# Patient Record
Sex: Female | Born: 2000
Health system: Midwestern US, Community
[De-identification: ages and names within clinical notes are randomized; demographics above are authoritative.]

## PROBLEM LIST (undated history)

## (undated) DIAGNOSIS — Z3492 Encounter for supervision of normal pregnancy, unspecified, second trimester: Secondary | ICD-10-CM

## (undated) DIAGNOSIS — O4700 False labor before 37 completed weeks of gestation, unspecified trimester: Secondary | ICD-10-CM

## (undated) DIAGNOSIS — Z3A36 36 weeks gestation of pregnancy: Secondary | ICD-10-CM

## (undated) DIAGNOSIS — F32A Depression, unspecified: Secondary | ICD-10-CM

## (undated) DIAGNOSIS — F419 Anxiety disorder, unspecified: Secondary | ICD-10-CM

## (undated) DIAGNOSIS — D649 Anemia, unspecified: Secondary | ICD-10-CM

## (undated) DIAGNOSIS — J45909 Unspecified asthma, uncomplicated: Secondary | ICD-10-CM

## (undated) HISTORY — PX: NO PAST SURGERIES: SHX2092

---
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
## 2021-05-08 ENCOUNTER — Emergency Department: Admit: 2021-05-09 | Payer: BLUE CROSS/BLUE SHIELD

## 2021-05-08 DIAGNOSIS — O2691 Pregnancy related conditions, unspecified, first trimester: Secondary | ICD-10-CM

## 2021-05-08 DIAGNOSIS — O219 Vomiting of pregnancy, unspecified: Secondary | ICD-10-CM

## 2021-05-08 NOTE — ED Provider Notes (Signed)
Chief complaint:  Abdominal pain    HPI history provided by the patient  Patient presents here gravida 2, para 1, aborta 0 currently about [redacted] weeks gestation per her account.  Has not had an ultrasound yet.  Complaining of several days of some low pelvic abdominal cramping.  Has had nausea and vomiting throughout the pregnancy and is on Phenergan at home for that stating she took some Phenergan earlier and it is controlling her nausea.  Denies dysuria or hematuria or flank pain.  States the pain is just slowly getting worse for last several days.  No fevers.  No dysuria.  No vaginal bleeding or discharge.    Review of Systems   Constitutional:  Negative for chills, diaphoresis, fatigue and fever.   HENT:  Negative for congestion and sore throat.    Respiratory:  Negative for cough, chest tightness, shortness of breath and wheezing.    Cardiovascular:  Negative for chest pain, palpitations and leg swelling.   Gastrointestinal:  Positive for abdominal pain and nausea. Negative for abdominal distention, diarrhea and vomiting.   Genitourinary:  Negative for dysuria, flank pain, frequency, hematuria, urgency, vaginal bleeding and vaginal discharge.   Musculoskeletal:  Negative for arthralgias, back pain, gait problem, joint swelling, myalgias, neck pain and neck stiffness.   Skin:  Negative for rash and wound.   Neurological:  Negative for dizziness, seizures, syncope, weakness, light-headedness, numbness and headaches.   All other systems reviewed and are negative.     Physical Exam  Vitals and nursing note reviewed.   Constitutional:       General: She is awake. She is not in acute distress.     Appearance: She is well-developed. She is not ill-appearing, toxic-appearing or diaphoretic.   HENT:      Head: Normocephalic and atraumatic.      Mouth/Throat:      Pharynx: Oropharynx is clear. Uvula midline. No pharyngeal swelling, oropharyngeal exudate, posterior oropharyngeal erythema or uvula swelling.      Tonsils: No  tonsillar exudate or tonsillar abscesses.   Eyes:      General: No scleral icterus.     Pupils: Pupils are equal, round, and reactive to light.   Cardiovascular:      Rate and Rhythm: Normal rate and regular rhythm.      Heart sounds: Normal heart sounds. No murmur heard.  Pulmonary:      Effort: Pulmonary effort is normal. No respiratory distress.      Breath sounds: Normal breath sounds. No stridor, decreased air movement or transmitted upper airway sounds. No decreased breath sounds, wheezing, rhonchi or rales.   Chest:      Chest wall: No tenderness.   Abdominal:      General: Bowel sounds are normal. There is no distension.      Palpations: Abdomen is soft.      Tenderness: There is no abdominal tenderness. There is no right CVA tenderness, left CVA tenderness, guarding or rebound.      Comments: Abdomen and anterior pelvic area soft and nontender during exam.  No focal right pelvic or left pelvic or right lower or left lower abdominal pain.  No distention.  No CVA tenderness.   Musculoskeletal:         General: No swelling, tenderness, deformity or signs of injury.      Cervical back: Full passive range of motion without pain, normal range of motion and neck supple. No signs of trauma or rigidity. No spinous process tenderness or  muscular tenderness. Normal range of motion.      Right lower leg: No edema.      Left lower leg: No edema.      Comments: Arms and legs are neurovascular intact and well perfused with no pretibial edema or calf pain.   Skin:     General: Skin is warm and dry.      Coloration: Skin is not cyanotic, jaundiced, mottled or pale.      Findings: No bruising, erythema or rash.   Neurological:      General: No focal deficit present.      Mental Status: She is alert and oriented to person, place, and time.      GCS: GCS eye subscore is 4. GCS verbal subscore is 5. GCS motor subscore is 6.      Cranial Nerves: No cranial nerve deficit.      Coordination: Coordination normal.   Psychiatric:          Behavior: Behavior is cooperative.        Procedures     MDM     History provided by: The patient    Patient here gravida 2, para 1, aborta 0 at [redacted] weeks gestation complaining of abdominal and pelvic pain and cramping for several days.  Worsening today.  Has not had an ultrasound yet.  Denies vaginal bleeding or discharge.  Denies dysuria or hematuria or flank pain.  No fevers.  Has had some nausea and vomiting throughout pregnancy controlled with Phenergan at home.  No chest pain.  Physical exam is unremarkable with no current abdominal pain on exam and no abdominal distention.  Heart rate regular, lungs are clear and equal.  See above for full exam.      Social factors affecting care: None  Chronic conditions affecting care: None  Chart reviewed: None    Differential includes but not limited to: Ectopic pregnancy, pregnancy, UTI    Work up includes with interpretations: Quantitative hCG 62,375.  CBC unremarkable white count 11.2, hemoglobin 11.9, hematocrit 34.7.  Not anemic, no significant sign of infection.  Afebrile.  CMP unremarkable renal function normal BUN 5 creatinine 0.5.  Electrolytes normal, LFTs normal.  Unlikely biliary disease.  Magnesium 1.9.  Urinalysis gross micro clean with no abnormalities no signs of infection.  Ultrasound pelvis read by radiologist shows single live intrauterine pregnancy 7 weeks 0 days no evidence of ectopic.    Advanced directive discussion: None    Treatment in [facility]:PRFF Tylenol    Consultations in ER: None    Diagnosis and disposition: Pregnancy.  Patient stable for discharge and outpatient follow-up.      ED Course as of 05/08/21 2252   Wed May 08, 2021   2251 Patient laying the bed resting comfortably no distress.  Updated on work-up results, states she is feeling much better and ready to go home.  She will follow closely with her OB/GYN. [NC]      ED Course User Index  [NC] Suzette Battiest, DO        ED Course as of 05/08/21 2253   Wed May 08, 2021   2251 Patient  laying the bed resting comfortably no distress.  Updated on work-up results, states she is feeling much better and ready to go home.  She will follow closely with her OB/GYN. [NC]      ED Course User Index  [NC] Ronda Fairly Maddox Hlavaty, DO       --------------------------------------------- PAST HISTORY ---------------------------------------------  Past  Medical History:  has a past medical history of Asthma.    Past Surgical History:  has no past surgical history on file.    Social History:  reports that she has never smoked. She has never been exposed to tobacco smoke. She has never used smokeless tobacco. She reports that she does not drink alcohol and does not use drugs.    Family History: family history is not on file.     The patient's home medications have been reviewed.    Allergies: Amoxicillin, Cefdinir, Cefzil [cefprozil], and Clindamycin/lincomycin    -------------------------------------------------- RESULTS -------------------------------------------------  Labs:  Results for orders placed or performed during the hospital encounter of 05/08/21   HCG, Quantitative, Pregnancy   Result Value Ref Range    hCG Quant 62375.0 (H) <10 mIU/mL   CBC with Auto Differential   Result Value Ref Range    WBC 11.2 4.5 - 11.5 E9/L    RBC 3.80 3.50 - 5.50 E12/L    Hemoglobin 11.9 11.5 - 15.5 g/dL    Hematocrit 16.1 09.6 - 48.0 %    MCV 91.3 80.0 - 99.9 fL    MCH 31.3 26.0 - 35.0 pg    MCHC 34.3 32.0 - 34.5 %    RDW 12.0 11.5 - 15.0 fL    Platelets 210 130 - 450 E9/L    MPV 9.2 7.0 - 12.0 fL    Neutrophils % 77.3 43.0 - 80.0 %    Immature Granulocytes % 0.6 0.0 - 5.0 %    Lymphocytes % 12.5 (L) 20.0 - 42.0 %    Monocytes % 7.8 2.0 - 12.0 %    Eosinophils % 1.4 0.0 - 6.0 %    Basophils % 0.4 0.0 - 2.0 %    Neutrophils Absolute 8.63 (H) 1.80 - 7.30 E9/L    Immature Granulocytes # 0.07 E9/L    Lymphocytes Absolute 1.39 (L) 1.50 - 4.00 E9/L    Monocytes Absolute 0.87 0.10 - 0.95 E9/L    Eosinophils Absolute 0.16 0.05 - 0.50 E9/L     Basophils Absolute 0.04 0.00 - 0.20 E9/L   Comprehensive Metabolic Panel   Result Value Ref Range    Sodium 137 132 - 146 mmol/L    Potassium 3.6 3.5 - 5.0 mmol/L    Chloride 103 98 - 107 mmol/L    CO2 22 22 - 29 mmol/L    Anion Gap 12 7 - 16 mmol/L    Glucose 99 74 - 99 mg/dL    BUN 5 (L) 6 - 20 mg/dL    Creatinine 0.5 0.5 - 1.0 mg/dL    Est, Glom Filt Rate >60 >=60 mL/min/1.73    Calcium 9.2 8.6 - 10.2 mg/dL    Total Protein 7.1 6.4 - 8.3 g/dL    Albumin 4.3 3.5 - 5.2 g/dL    Total Bilirubin 0.5 0.0 - 1.2 mg/dL    Alkaline Phosphatase 41 35 - 104 U/L    ALT 9 0 - 32 U/L    AST 12 0 - 31 U/L   Magnesium   Result Value Ref Range    Magnesium 1.9 1.6 - 2.6 mg/dL   Urinalysis   Result Value Ref Range    Color, UA Yellow Straw/Yellow    Clarity, UA Clear Clear    Glucose, Ur Negative Negative mg/dL    Bilirubin Urine Negative Negative    Ketones, Urine Negative Negative mg/dL    Specific Gravity, UA 1.020 1.005 - 1.030    Blood,  Urine Negative Negative    pH, UA 6.5 5.0 - 9.0    Protein, UA Negative Negative mg/dL    Urobilinogen, Urine 0.2 <2.0 E.U./dL    Nitrite, Urine Negative Negative    Leukocyte Esterase, Urine Negative Negative       Radiology:  US OB TRANSVAGINAL   Final Result   Single live intrauterine pregnancy with EGA [redacted] weeks, 0 days for EDD   12/25/2021.      No sonographic evidence for an ectopic pregnancy.      Trace fluid in endocervical canal which is nonspecific.      Suspect corpus luteum cyst in right ovary, as detailed above.      Please correlate with serial beta HCGs and follow-up pelvic ultrasound.             ------------------------- NURSING NOTES AND VITALS REVIEWED ---------------------------  Date / Time Roomed:  05/08/2021  9:02 PM  ED Bed Assignment:  08/08    The nursing notes within the ED encounter and vital signs as below have been reviewed.   BP (!) 106/54   Pulse 90   Temp 98.8 F (37.1 C) (Oral)   Resp 18   Ht 5\' 1"  (1.549 m)   Wt 110 lb (49.9 kg)   SpO2 100%   BMI 20.78  kg/m   Oxygen Saturation Interpretation: Normal      ------------------------------------------ PROGRESS NOTES ------------------------------------------  I have spoken with the patient and discussed today's results, in addition to providing specific details for the plan of care and counseling regarding the diagnosis and prognosis.  Their questions are answered at this time and they are agreeable with the plan. I discussed at length with them reasons for immediate return here for re evaluation. They will followup with primary care by calling their office tomorrow.      --------------------------------- ADDITIONAL PROVIDER NOTES ---------------------------------  At this time the patient is without objective evidence of an acute process requiring hospitalization or inpatient management.  They have remained hemodynamically stable throughout their entire ED visit and are stable for discharge with outpatient follow-up.     The plan has been discussed in detail and they are aware of the specific conditions for emergent return, as well as the importance of follow-up.      New Prescriptions    No medications on file       Diagnosis:  1. Complication of pregnancy in first trimester        Disposition:  Patient's disposition: Discharge to home  Patient's condition is stable.           , DO  05/08/21 2253

## 2021-05-08 NOTE — ED Notes (Signed)
Patient to ultrasound     Cherre Robins, RN  05/08/21 2129

## 2021-05-08 NOTE — Discharge Instructions (Signed)
Return if symptoms change or worsen.     Thank you for the opportunity to care for you during this emergency department visit. I hope you are doing better. We strive to always improve our care. You may receive a survey and I hope you had a positive experience here. We greatly appreciate your 5 scores.

## 2021-05-09 ENCOUNTER — Inpatient Hospital Stay
Admit: 2021-05-09 | Discharge: 2021-05-09 | Disposition: A | Discharge status: Home / Self Care | Payer: BLUE CROSS/BLUE SHIELD | Attending: Emergency Medicine

## 2021-05-09 LAB — COMPREHENSIVE METABOLIC PANEL
ALT: 9 U/L (ref 0–32)
AST: 12 U/L (ref 0–31)
Albumin: 4.3 g/dL (ref 3.5–5.2)
Alkaline Phosphatase: 41 U/L (ref 35–104)
Anion Gap: 12 mmol/L (ref 7–16)
BUN: 5 mg/dL — ABNORMAL LOW (ref 6–20)
CO2: 22 mmol/L (ref 22–29)
Calcium: 9.2 mg/dL (ref 8.6–10.2)
Chloride: 103 mmol/L (ref 98–107)
Creatinine: 0.5 mg/dL (ref 0.5–1.0)
Est, Glom Filt Rate: >60 mL/min/{1.73_m2} (ref >=60)
Glucose: 99 mg/dL (ref 74–99)
Potassium: 3.6 mmol/L (ref 3.5–5.0)
Sodium: 137 mmol/L (ref 132–146)
Total Bilirubin: 0.5 mg/dL (ref 0.0–1.2)
Total Protein: 7.1 g/dL (ref 6.4–8.3)

## 2021-05-09 LAB — CBC WITH AUTO DIFFERENTIAL
Basophils %: 0.4 % (ref 0.0–2.0)
Basophils Absolute: 0.04 E9/L (ref 0.00–0.20)
Eosinophils %: 1.4 % (ref 0.0–6.0)
Eosinophils Absolute: 0.16 E9/L (ref 0.05–0.50)
Hematocrit: 34.7 % (ref 34.0–48.0)
Hemoglobin: 11.9 g/dL (ref 11.5–15.5)
Immature Granulocytes #: 0.07 E9/L
Immature Granulocytes %: 0.6 % (ref 0.0–5.0)
Lymphocytes %: 12.5 % — ABNORMAL LOW (ref 20.0–42.0)
Lymphocytes Absolute: 1.39 E9/L — ABNORMAL LOW (ref 1.50–4.00)
MCH: 31.3 pg (ref 26.0–35.0)
MCHC: 34.3 % (ref 32.0–34.5)
MCV: 91.3 fL (ref 80.0–99.9)
MPV: 9.2 fL (ref 7.0–12.0)
Monocytes %: 7.8 % (ref 2.0–12.0)
Monocytes Absolute: 0.87 E9/L (ref 0.10–0.95)
Neutrophils %: 77.3 % (ref 43.0–80.0)
Neutrophils Absolute: 8.63 E9/L — ABNORMAL HIGH (ref 1.80–7.30)
Platelets: 210 E9/L (ref 130–450)
RBC: 3.8 E12/L (ref 3.50–5.50)
RDW: 12 fL (ref 11.5–15.0)
WBC: 11.2 E9/L (ref 4.5–11.5)

## 2021-05-09 LAB — URINALYSIS
Bilirubin Urine: NEGATIVE
Blood, Urine: NEGATIVE
Glucose, Ur: NEGATIVE mg/dL
Ketones, Urine: NEGATIVE mg/dL
Leukocyte Esterase, Urine: NEGATIVE
Nitrite, Urine: NEGATIVE
Protein, UA: NEGATIVE mg/dL
Specific Gravity, UA: 1.02 (ref 1.005–1.030)
Urobilinogen, Urine: 0.2 E.U./dL (ref <2.0)
pH, UA: 6.5 (ref 5.0–9.0)

## 2021-05-09 LAB — MAGNESIUM: Magnesium: 1.9 mg/dL (ref 1.6–2.6)

## 2021-05-09 LAB — HCG, QUANTITATIVE, PREGNANCY: hCG Quant: 62375 m[IU]/mL — ABNORMAL HIGH (ref <10)

## 2021-05-09 MED ORDER — SODIUM CHLORIDE 0.9 % IV BOLUS
0.9 % | Freq: Once | INTRAVENOUS | Status: AC
Start: 2021-05-09 — End: 2021-05-08
  Administered 2021-05-09: 01:00:00 1000 mL via INTRAVENOUS

## 2021-05-09 MED ORDER — ACETAMINOPHEN 325 MG PO TABS
325 MG | Freq: Once | ORAL | Status: AC
Start: 2021-05-09 — End: 2021-05-08
  Administered 2021-05-09: 01:00:00 650 mg via ORAL

## 2021-05-09 MED FILL — MAPAP 325 MG PO TABS: 325 MG | ORAL | Qty: 2

## 2021-06-23 IMAGING — US US MFM OB LIMITED
2 series · 15 of 19 positions shown · non-contrast
Comparison: none

[Series 1: us mfm ob limited · 14 of 18 slices shown (1 of 2)]
[im 1/18]
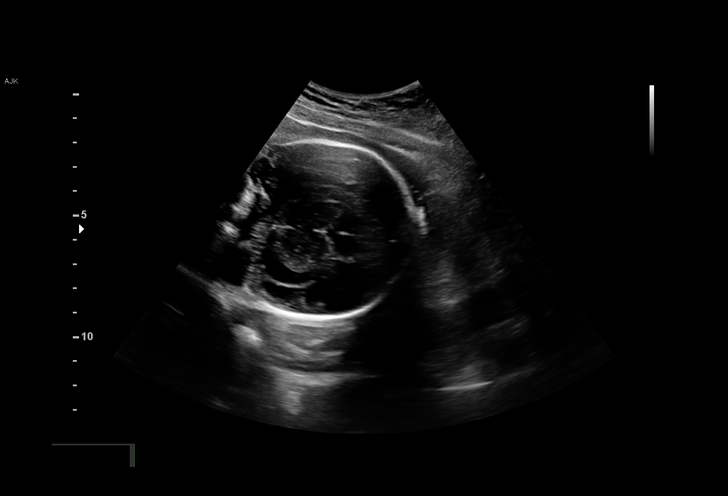
[im 2/18]
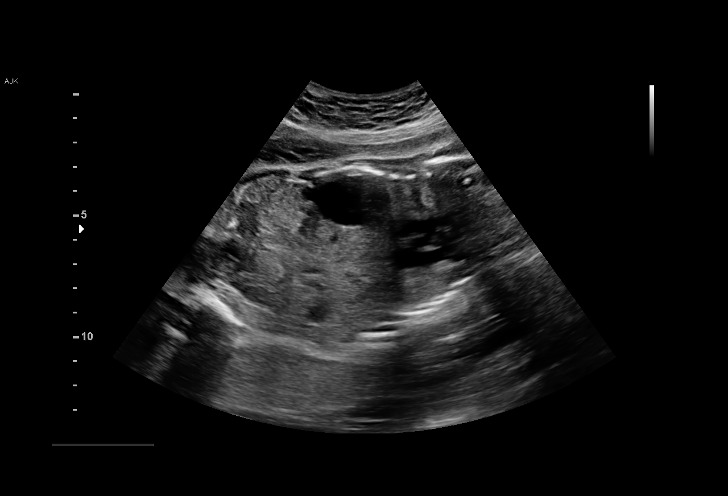
[im 4/18]
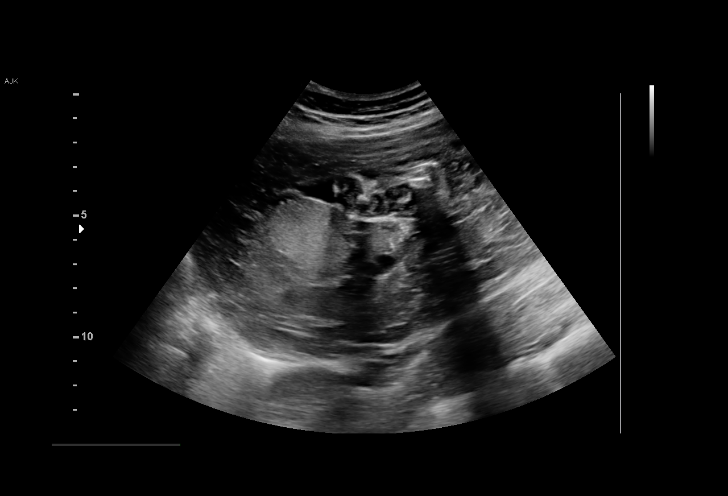
[im 5/18]
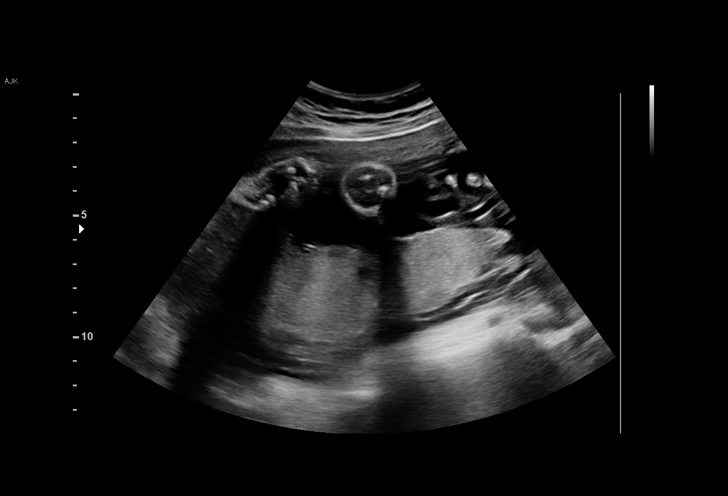
[im 6/18]
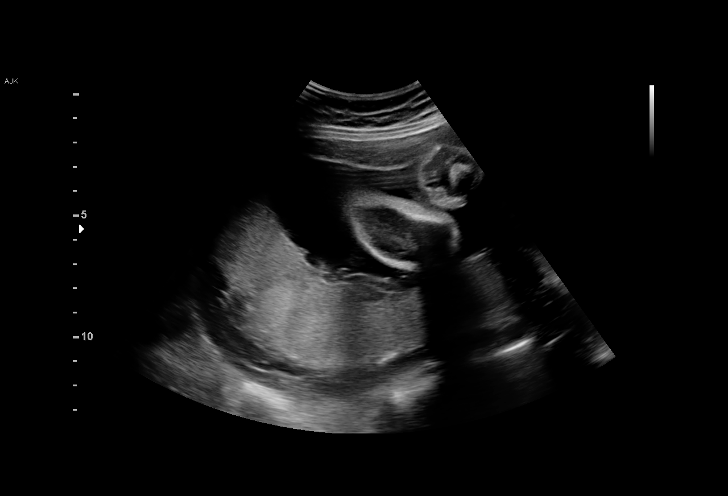
[im 7/18]
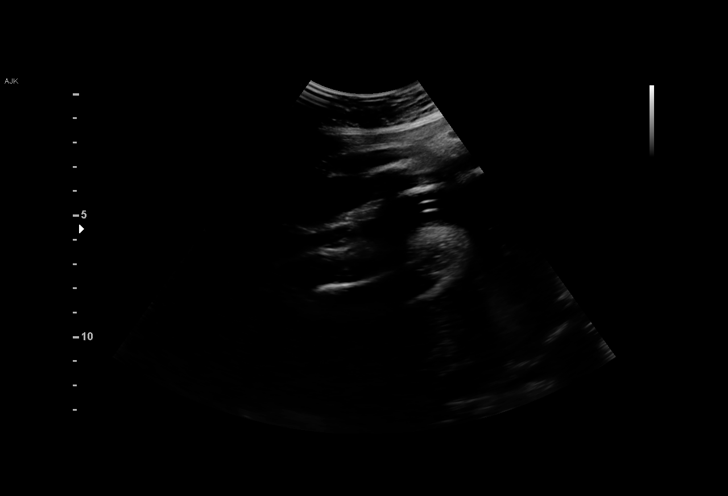
[im 9/18]
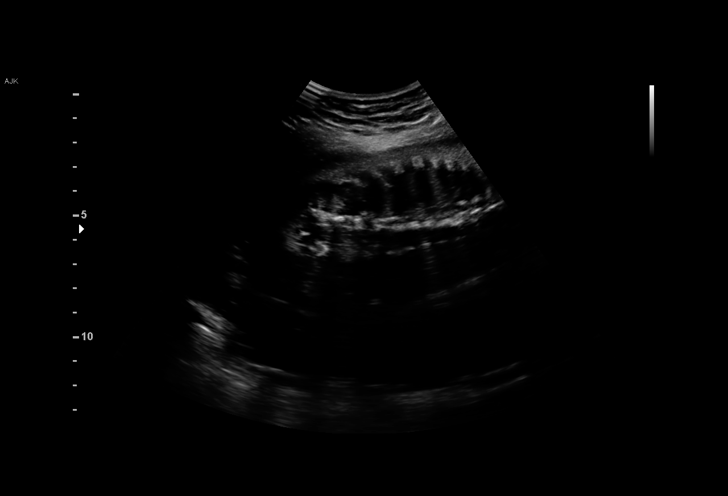
[im 10/18]
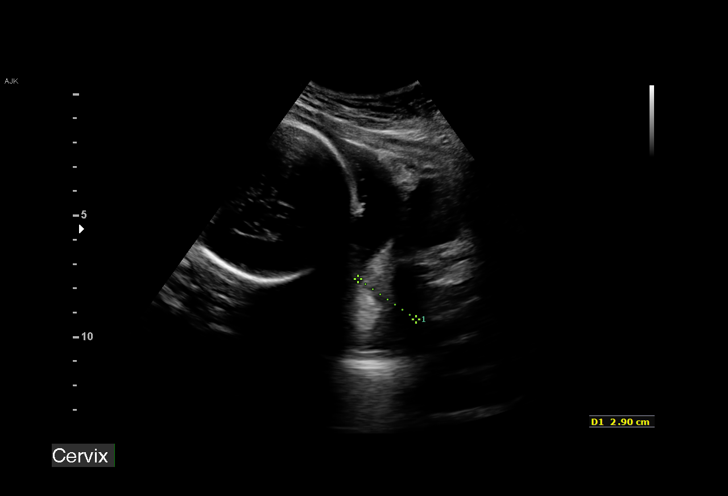
[im 11/18]
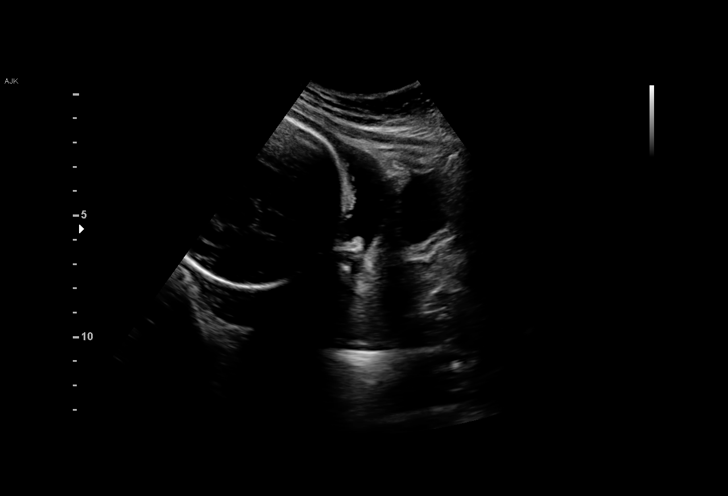
[im 13/18]
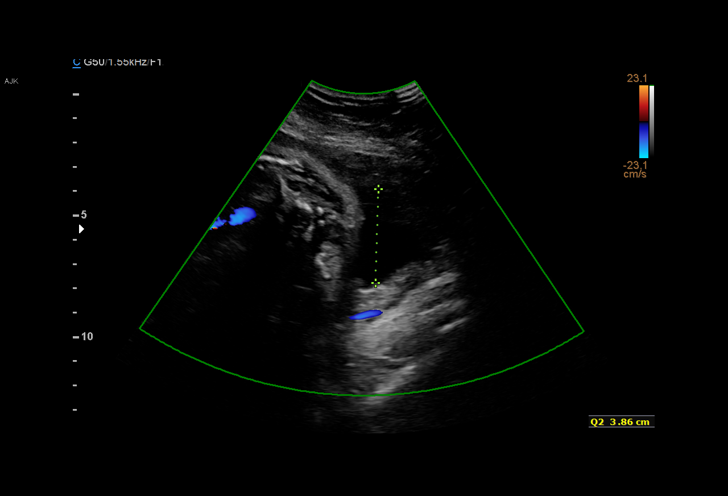
[im 14/18]
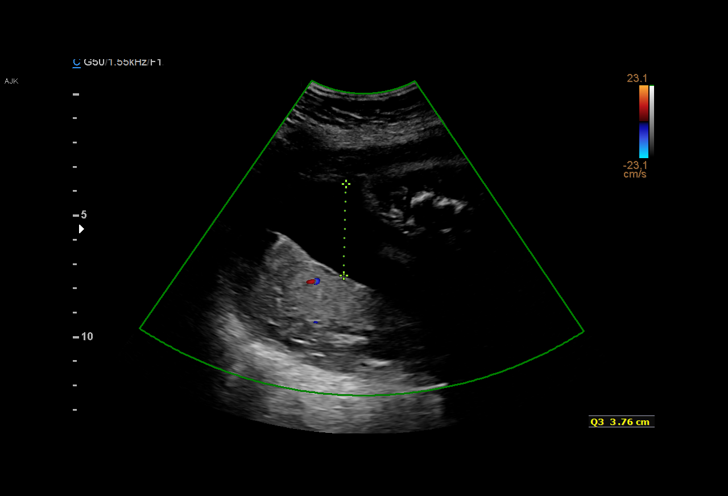
[im 15/18]
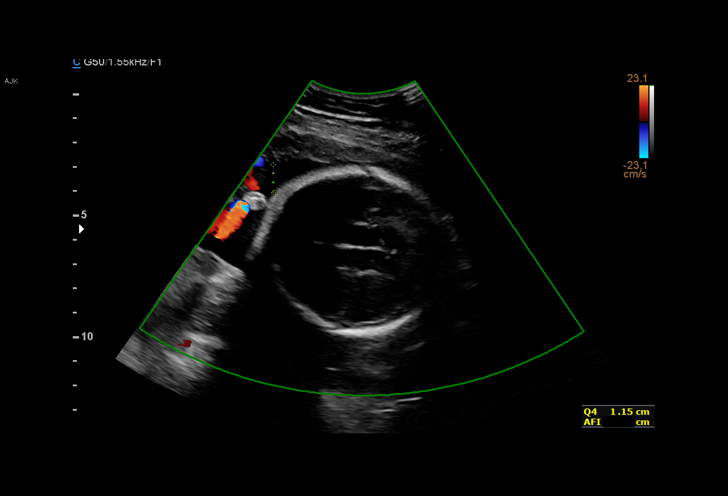
[im 16/18]
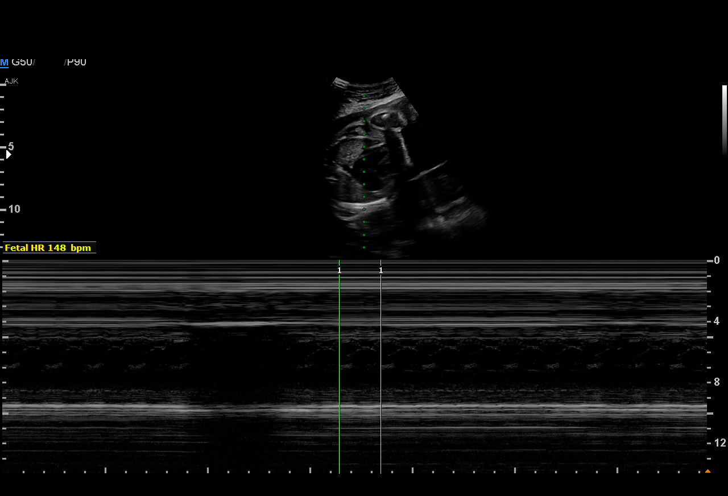
[im 18/18]
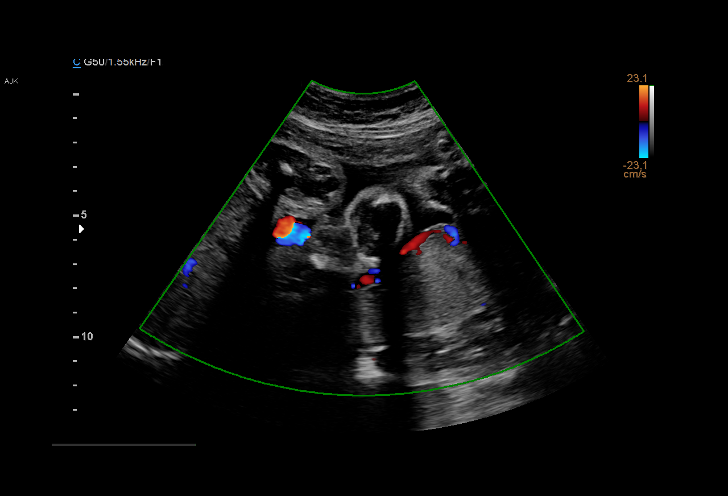

[Series 3: us mfm ob limited · 1 of 1 slices shown (2 of 2)]
[im 1/1]
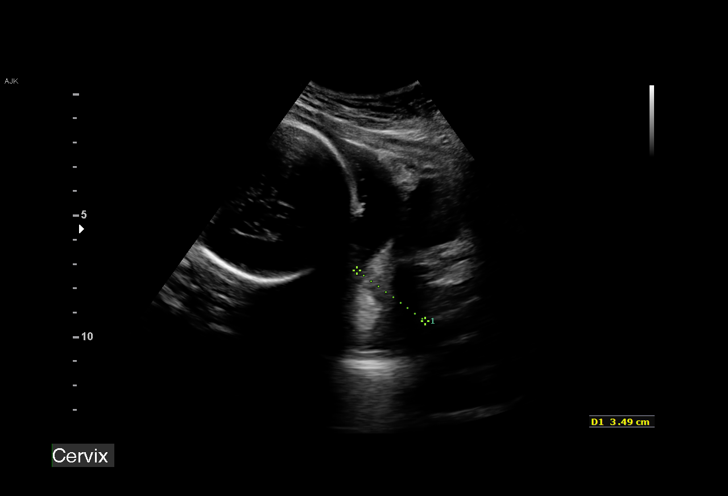

[15 of 19 positions shown; findings below may reference images not displayed]

1  US MFM OB LIMITED                    76815.01     RAVIYE KNK
 ----------------------------------------------------------------------

 ----------------------------------------------------------------------
Indications

  Preterm contractions
  Pelvic pain affecting pregnancy in third
  trimester
  30 weeks gestation of pregnancy
 ----------------------------------------------------------------------
Fetal Evaluation

 Num Of Fetuses:          1
 Fetal Heart Rate(bpm):   148
 Cardiac Activity:        Observed
 Presentation:            Cephalic
 Placenta:                Posterior
 P. Cord Insertion:       Visualized, central

 Amniotic Fluid
 AFI FV:      Within normal limits

 AFI Sum(cm)     %Tile       Largest Pocket(cm)
 13.52           42

 RUQ(cm)       RLQ(cm)       LUQ(cm)        LLQ(cm)


 Comment:    Stomach, bladder, and diaphragm seen. No placental abruption
             or previa identified.
OB History

 Gravidity:    1
Gestational Age

 Clinical EDD:  30w 0d                                        EDD:   01/14/19
 Best:          30w 0d     Det. By:  Clinical EDD             EDD:   01/14/19
Cervix Uterus Adnexa

 Cervix
 Length:           3.49  cm.
 Normal appearance by transabdominal scan.

 Uterus
 No abnormality visualized.

 Left Ovary
 Not visualized.

 Right Ovary
 Not visualized.

 Adnexa
 No abnormality visualized.
Impression

 Limited exam
 Normal amniotic fluid
 Preterm labor
 Normal cervcial length of > 2.5 cm
Recommendations

 Follow up as clinically indicated.

## 2021-07-29 ENCOUNTER — Inpatient Hospital Stay
Admit: 2021-07-29 | Discharge: 2021-07-30 | Disposition: A | Discharge status: Home / Self Care | Payer: BLUE CROSS/BLUE SHIELD

## 2021-07-29 DIAGNOSIS — N390 Urinary tract infection, site not specified: Secondary | ICD-10-CM

## 2021-07-29 NOTE — ED Provider Notes (Signed)
Department of Emergency Medicine   [facility]  Provider Note  Admit Date/RoomTime: 07/29/2021  7:39 PM  Room: 04/04      NAME: Lynn Frye  DOB: [birth year]  MRN: [id number]     Chief Complaint:  Urinary Tract Infection (Lower back pain/urgency)    History of Present Illness       Lynn Frye is a 21 y.o. old female who has a past medical history of:   Past Medical History:   Diagnosis Date    Asthma     presents to the urgent care center by private vehicle, for evaluation. She believes that she has a UTI.  She said that she feels like she is not emptying her bladder completely,and is having  frequency and urgency.  She denies any fever is not complaining of any back or abdominal pain to me just a feeling of not in her bladder completely. She is [redacted] weeks pregnant not having any abdominal cramping or discharge.    ROS    Pertinent positives and negatives are stated within HPI, all other systems reviewed and are negative.    History reviewed. No pertinent surgical history.Social History:  reports that she has never smoked. She has never been exposed to tobacco smoke. She has never used smokeless tobacco. She reports that she does not drink alcohol and does not use drugs.  Family History: family history is not on file.   Allergies: Amoxicillin, Cefdinir, Cefzil [cefprozil], and Clindamycin/lincomycin    Physical Exam      Oxygen Saturation Interpretation: Normal.        ED Triage Vitals   BP Temp Temp src Pulse Respirations SpO2 Height Weight - Scale   07/29/21 1943 07/29/21 1944 -- 07/29/21 1943 07/29/21 1944 07/29/21 1943 07/29/21 1944 07/29/21 1944   113/75 98.7 F (37.1 C)  (!) 119 20 100 % 5\' 1"  (1.549 m) 120 lb (54.4 kg)         Constitutional:  Alert, appears well  HEENT:  NC/NT.  Airway patent.  Neck:  Normal ROM.  Supple.  Respiratory:  Lungs Clear to auscultation and breath sounds equal.  CV:  Regular rate and rhythm, normal heart sounds, without pathological murmurs,  ectopy, gallops, or rubs.  GI:  no tenderness.  .  Integument:  .  Warm, dry, without visible rash, unless noted elsewhere.  Lymphatics: No lymphangitis or adenopathy noted.  Neurological:  Oriented.  Motor functions intact.    Lab / Imaging Results   (All laboratory and radiology results have been personally reviewed by myself)  Labs:  Results for orders placed or performed during the hospital encounter of 07/29/21   Urinalysis, Chem   Result Value Ref Range    Color, UA Yellow Yellow    Turbidity UA Clear Clear    Glucose, Ur NEGATIVE NEGATIVE mg/dL    Bilirubin Urine NEGATIVE NEGATIVE    Ketones, Urine NEGATIVE NEGATIVE mg/dL    Specific Gravity, UA 1.015 1.005 - 1.030    Urine Hgb NEGATIVE NEGATIVE    pH, UA 7.0 5.0 - 9.0    Protein, UA NEGATIVE NEGATIVE mg/dL    Urobilinogen, Urine 0.2 0.0 - 1.0 EU/dL    Nitrite, Urine NEGATIVE NEGATIVE    Leukocyte Esterase, Urine SMALL (A) NEGATIVE   Microscopic Urinalysis   Result Value Ref Range    WBC, UA 6 TO 9 /HPF    RBC, UA 0 TO 2 /HPF    Bacteria, UA 1+ (A) None  Imaging:  All Radiology results interpreted by Radiologist unless otherwise noted.  No orders to display     ED Course / Medical Decision Making   Medications - No data to display       Consults:   None    Medical Decision Making:    Patient is well appearing, non toxic and appropriate for outpatient management.  She is [redacted] weeks pregnant and she does have positive findings on her UA.  I did discuss this case with clinical pharmacist because she is allergic to cephalosporins amoxicillin and clindamycin I spoke with Jesusita Oka at [facility] he is the  clinical  pharmacist.  He suggested that we give Macrobid he said it safe during this stage of the pregnancy.  I did discuss this with the patient.  I did order that for her.      Assessment      1. Urinary tract infection without hematuria, site unspecified      Plan   Disposition: Discharge to home and advised to contact follow up with your Pcp          Patient condition is good    New Medications     Discharge Medication List as of 07/29/2021  8:13 PM        START taking these medications    Details   nitrofurantoin, macrocrystal-monohydrate, (MACROBID) 100 MG capsule Take 1 capsule by mouth 2 times daily for 5 days, Disp-10 capsule, R-0Print           Electronically signed by Reino Kent, APRN - CNP   DD: 07/29/21  **This report was transcribed using voice recognition software. Every effort was made to ensure accuracy; however, inadvertent computerized transcription errors may be present.  END OF ED PROVIDER NOTE            Reino Kent, APRN - CNP  07/31/21 0825

## 2021-07-30 LAB — URINALYSIS, CHEM
Bilirubin Urine: NEGATIVE
Glucose, Ur: NEGATIVE mg/dL
Ketones, Urine: NEGATIVE mg/dL
Nitrite, Urine: NEGATIVE
Protein, UA: NEGATIVE mg/dL
Specific Gravity, UA: 1.015 (ref 1.005–1.030)
Urine Hgb: NEGATIVE
Urobilinogen, Urine: 0.2 EU/dL (ref 0.0–1.0)
pH, UA: 7 (ref 5.0–9.0)

## 2021-07-30 LAB — MICROSCOPIC URINALYSIS
RBC, UA: 0 /HPF
WBC, UA: 6 /HPF

## 2021-07-30 MED ORDER — NITROFURANTOIN MONOHYD MACRO 100 MG PO CAPS
100 MG | ORAL_CAPSULE | Freq: Two times a day (BID) | ORAL | 0 refills | Status: AC
Start: 2021-07-30 — End: 2021-08-03

## 2021-08-01 LAB — CULTURE, URINE

## 2021-08-14 ENCOUNTER — Ambulatory Visit: Admit: 2021-08-14 | Discharge: 2021-08-14 | Payer: BLUE CROSS/BLUE SHIELD | Attending: Maternal & Fetal Medicine

## 2021-08-14 ENCOUNTER — Ambulatory Visit: Admit: 2021-08-14 | Discharge: 2021-08-14 | Payer: BLUE CROSS/BLUE SHIELD | Attending: Advanced Practice Midwife

## 2021-08-14 ENCOUNTER — Ambulatory Visit: Admit: 2021-08-14 | Payer: BLUE CROSS/BLUE SHIELD

## 2021-08-14 DIAGNOSIS — Z3492 Encounter for supervision of normal pregnancy, unspecified, second trimester: Secondary | ICD-10-CM

## 2021-08-14 DIAGNOSIS — Z3A21 21 weeks gestation of pregnancy: Secondary | ICD-10-CM

## 2021-08-14 DIAGNOSIS — O09212 Supervision of pregnancy with history of pre-term labor, second trimester: Secondary | ICD-10-CM

## 2021-08-14 DIAGNOSIS — O219 Vomiting of pregnancy, unspecified: Secondary | ICD-10-CM

## 2021-08-14 MED ORDER — ONDANSETRON HCL 4 MG PO TABS
4 MG | ORAL_TABLET | Freq: Three times a day (TID) | ORAL | 0 refills | Status: DC | PRN
Start: 2021-08-14 — End: 2021-10-09

## 2021-08-14 NOTE — Progress Notes (Unsigned)
Lynn Frye is a 21 y.o. female [redacted]w[redacted]d    G2P1001    Was seeing providers at [place] but lives in [place], now own insurance, history of 1 previous baby, had PTL x 2 and NSVD.   Had "really bad morning sickness, phenergan helpful." Still vomiting 1x per day.     Expecting a girl    Had epidural last time, uncertain this time  Plans to breastfeed.         OB History   Gravida Para Term Preterm AB Living   2 1 1     1    SAB IAB Ectopic Molar Multiple Live Births             1      # Outcome Date GA Lbr Len/2nd Weight Sex Delivery Anes PTL Lv   2 Current            1 Term 01/20/19 [redacted]w[redacted]d  7 lb 10 oz (3.459 kg) M Vag-Spont EPI  LIV         Mother's Prenatal Vitals  BP: 111/76  Weight - Scale: 123 lb 3.2 oz (55.9 kg)  Pulse: 95  Patient Position: Sitting  Prenatal Fetal Information  Fetal HR: 144  Movement: Present    The patient was seen and evaluated. There was {gen pos neg:315643} fetal movements. No contractions or leakage of fluid. Signs and symptoms of preterm labor as well as labor were reviewed.The Nuchal Translucency testing was reviewed and found to be {Desc; normal/:32111} A single marker MSAFP was reviewed and found to be {Desc; normal/:32111}. The patients anatomy ultrasound has been completed and reviewed with patient. TOP ST OH Reviewed. A 28 week lab panel was ordered. This includes a (HH, 1 hr GTT, U/A C&S). The patient is to complete this in the next two to four weeks.    The S/S of Pre-Eclampsia were reviewed with the patient in detail. She is to report any of these if they occur. She currently {denies/complains:31533} any of these.    The patient is RH {Desc; negative/positive:13464::"negative"} Rhogam Ordered {yes no:314532}    Assessment:  1. Lynn Frye is a 21 y.o. female  2. G2P1001  3. [redacted]w[redacted]d    There are no problems to display for this patient.      {No diagnosis found. (Refresh or delete this SmartLink)}      Plan:  The patient will return to the office for her next visit in ***  weeks. See antepartum flow sheet.

## 2021-08-14 NOTE — Progress Notes (Unsigned)
Patient here today for new OB visit.  Patient is transferring care from [facility].  Records available in Care Everywhere.  EDC is 12/25/21.  Patient denies feeling any contractions, leaking of fluid or vaginal bleeding.  Perceives fetal movement.  Patient states she was recently treated for a UTI at an urgent care and prescribed Macrobid but feels no improvement.  Clean catch urine obtained, labeled and sent to lab via courier.  Discharge instructions have been discussed with the patient by C.Ridenbaugh, CNM and patient voiced understanding of plan of care.  Patient advised to call our office with any questions or concerns.

## 2021-08-14 NOTE — Patient Instructions (Signed)
Please arrive for your scheduled appointment at least 15 minutes early with your actual insurance card+ a photo ID. Also if you need any refills ordered or have questions, it may take up 48 hours to reply. Please allow ample time for your refills. Call me when you use last refill. Thank you for your cooperation.  Call your primary obstetrician with bleeding, leaking of fluid, abdominal tenderness, headache, blurry vision, epigastric pain and increased urinary frequency. If you are experiencing an emergency and need immediate help, call 911 or go to go emergency room or labor and delivery.  if you are sick, not feeling well or have an infectious process going on please reschedule your appointment by calling [phone]. Also if any family members are not feeling well, please do not bring them to your appointment. We appreciate your cooperation. We are doing this in order to protect our pregnant mothers+ their babies. if you are sick, not feeling well or have an infectious process going on please reschedule your appointment by calling [phone]. Also if any family members are not feeling well, please do not bring them to your appointment. We appreciate your cooperation. We are doing this in order to protect our pregnant mothers+ their babies.

## 2021-08-14 NOTE — Progress Notes (Signed)
Pt here for new patient anatomy scan  Pt denies any bleeding/cramping/lof  Pt states good fetal movement   Pt did not leave urine specimen in triage

## 2021-08-15 LAB — URINALYSIS WITH MICROSCOPIC
Bilirubin Urine: NEGATIVE
Epithelial Cells UA: 0 /HPF
Glucose, Ur: NEGATIVE mg/dL
Ketones, Urine: NEGATIVE mg/dL
Nitrite, Urine: NEGATIVE
Protein, UA: NEGATIVE mg/dL
RBC, UA: 0 /HPF
Specific Gravity, UA: 1.02 (ref 1.005–1.030)
Urobilinogen, Urine: 0.2 EU/dL (ref 0.0–1.0)
WBC, UA: 6 /HPF
pH, UA: 7 (ref 5.0–9.0)

## 2021-08-16 LAB — CULTURE, URINE

## 2021-09-04 ENCOUNTER — Inpatient Hospital Stay
Admit: 2021-09-04 | Discharge: 2021-09-04 | Disposition: A | Discharge status: Home / Self Care | Payer: BLUE CROSS/BLUE SHIELD | Attending: Obstetrics & Gynecology | Admitting: Obstetrics & Gynecology

## 2021-09-04 ENCOUNTER — Ambulatory Visit: Admit: 2021-09-04 | Payer: BLUE CROSS/BLUE SHIELD

## 2021-09-04 LAB — URINALYSIS WITH MICROSCOPIC
Bilirubin Urine: NEGATIVE
Epithelial Cells UA: 3 /HPF
Glucose, Ur: NEGATIVE mg/dL
Ketones, Urine: NEGATIVE mg/dL
Nitrite, Urine: NEGATIVE
Protein, UA: NEGATIVE mg/dL
RBC, UA: 0 /HPF
Specific Gravity, UA: <1.005 — ABNORMAL LOW (ref 1.005–1.030)
Urine Hgb: NEGATIVE
Urobilinogen, Urine: 0.2 EU/dL (ref 0.0–1.0)
WBC, UA: 6 /HPF — AB
pH, UA: 6.5 (ref 5.0–9.0)

## 2021-09-04 LAB — WET PREP, GENITAL
Trich, Wet Prep: NONE SEEN
Yeast, Wet Prep: NONE SEEN

## 2021-09-04 LAB — FETAL FIBRONECTIN: Fetal Fibronectin: NEGATIVE

## 2021-09-04 MED ORDER — LACTATED RINGERS IV SOLN
INTRAVENOUS | Status: DC
Start: 2021-09-04 — End: 2021-09-04
  Administered 2021-09-04: 19:00:00 via INTRAVENOUS

## 2021-09-04 MED ORDER — METRONIDAZOLE 500 MG PO TABS
500 MG | ORAL_TABLET | Freq: Three times a day (TID) | ORAL | 0 refills | Status: AC
Start: 2021-09-04 — End: ?

## 2021-09-04 MED ORDER — LACTATED RINGERS IV BOLUS
Freq: Once | INTRAVENOUS | Status: AC
Start: 2021-09-04 — End: 2021-09-04
  Administered 2021-09-04: 18:00:00 1000 mL via INTRAVENOUS

## 2021-09-04 NOTE — Progress Notes (Signed)
Pt feeling well after walking. Ok to discharge.

## 2021-09-04 NOTE — Telephone Encounter (Signed)
Lynn Frye has been having cramps and lower back pain since 6AM.  She is [redacted]w[redacted]d.  She is going to L&D to be checked.

## 2021-09-04 NOTE — H&P (Cosign Needed)
Department of Obstetrics and Gynecology  Nurse Practitioner Obstetrics History and Physical        CHIEF COMPLAINT:  cramping and vaginal discharge    HISTORY OF PRESENT ILLNESS:  Lynn Frye is a 21 y.o. female G2P1001, No LMP recorded (lmp unknown). Patient is pregnant.,  at [redacted]w[redacted]d.     Presents to L&D with cramping and vaginal discharge that started this morning.  States she had something similar when she was pregnant with her first baby, had BV and preterm contractions treated with Terbutaline, delivered at term.  She thinks she might have this again.  Denies bleeding, reports good FM.  Pregnancy uncomplicated, history of 1 previous vaginal birth       OB History       Gravida   2    Para   1    Term   1    Preterm        AB        Living   1         SAB        IAB        Ectopic        Molar        Multiple        Live Births   1                Estimated Due Date: Estimated Date of Delivery: 12/25/21      Pregnancy complicated by: There is no problem list on file for this patient.          PAST OB HISTORY  OB History       Gravida   2    Para   1    Term   1    Preterm        AB        Living   1         SAB        IAB        Ectopic        Molar        Multiple        Live Births   1                  Past Medical History:          Diagnosis Date    Asthma        Past Surgical History:      History reviewed. No pertinent surgical history.    Social History:    TOBACCO:   reports that she has never smoked. She has never been exposed to tobacco smoke. She has never used smokeless tobacco.  ETOH:   reports no history of alcohol use.  DRUGS:   reports no history of drug use.  Family History:       Problem Relation Age of Onset    Heart Attack Father        Medications Prior to Admission:  Medications Prior to Admission: ondansetron (ZOFRAN) 4 MG tablet, Take 1 tablet by mouth 3 times daily as needed for Nausea or Vomiting  [DISCONTINUED] promethazine (PHENERGAN) 25 MG tablet, Take 1 tablet by mouth every 6 hours  as needed for Nausea  Prenatal Vit-Fe Fumarate-FA (PRENATAL 1+1 PO), Take by mouth    Allergies:  Amoxicillin, Cefdinir, Cefzil [cefprozil], and Clindamycin/lincomycin      REVIEW OF SYSTEMS:  CONSTITUTIONAL :      No fever, no chills   HEENT :                         Headache absent,   visual disturbances absent  CARDIOVASCULAR :    No chest pain, no palpitations, no edema   RESPIRATORY :            No pain, no shortness of breath   GASTROINTESTINAL : No N/V, no D/C,    abdominal pain absent   GENITOURINARY :      Dysuria   absent,   hematuria absent,   urinary frequency absent  MUSCULOSKELETAL:  No myalgia,   back pain absent  INTEGUMENTARY:  Warm, dry, no rashes  NEUROLOGICAL :        No migraine, no seizures.     Pertinent positives and negatives addressed in HPI, other systems reviewed and negative      PHYSICAL EXAM:    BP 114/63   Pulse 91   Temp 98.3 F (36.8 C) (Oral)   Resp 16   LMP  (LMP Unknown)     General appearance:  awake, alert, cooperative, no apparent distress, and appears stated age  Neurologic:  Awake, alert, oriented to name, place and time.  Ambulatory to unit      Lungs:  Respirations easy and unlabored    Abdomen:   soft, gravid, non-tender,   Fetal position NA due to gestational age   EFW AGA  GU:  CVA tenderness NA     Fetal heart rate:  Baseline Heart Rate 150    Pelvis:  External Genitalia: Lesions absent  SSE:  performed no blood or fluid in vault, thick yellow/white mucus, no odor, cervix appears closed  Cervix:    Closed per speculum exam    Contractions:  irritability  Membranes:  intact      GBS: unknown      Impression:  21 y.o. G2P1001 [redacted]w[redacted]d preterm contractions, tracing appropriate for gestational age    Discussed with Dr. Marchia Meiers     Plan:  Observe, FFN, UA, wet prep, IV fluids, consult MFM  Consult discussed with Dr. Leda Gauze      Electronically signed by Ricke Hey, APRN - CNM on 09/04/2021 at 1:03 PM

## 2021-09-04 NOTE — Progress Notes (Addendum)
G2p1 24w presents to antepartum with complaints of back pain and cramping that started this morning around 6am, pt took tylenol and a bath that did not give any relief, pt called her midwife and was advised to come to the hospital. Pt denies any leaking or bleeding, states increase in white discharge, pt also states cramps feel similar to when she had BV with her first pregnancy. States good fetal movement, placed on efm. House officer notified.

## 2021-09-04 NOTE — Progress Notes (Signed)
R. Sieman updated. Awaiting Dr Roseanne Reno to round.

## 2021-09-04 NOTE — Progress Notes (Signed)
Pt to come off EFM and ambulate, assess how pt is feeling after for possible d/c to home per dr Roseanne Reno.

## 2021-09-04 NOTE — Progress Notes (Signed)
Discharge instructions given to the patient at the bedside. Pt verbalizes understanding of all instructions including when to call provider and/or return to the hospital. Pt verbalizes understanding to keep all scheduled appointments and to pick up presciption. Pt denies any questions and exits ambulatory in good condition.

## 2021-09-04 NOTE — Discharge Instructions (Signed)
Home Undelivered Discharge Instructions    After Discharge Orders:    Follow up with physician on your next scheduled appointment            Diet:  normal diet as tolerated    Rest: on left side, normal activity as tolerated, no sex, no douching and no tub baths    Other instructions: Do kick counts once a day on your baby. Choose the time of day your baby is most active. Get in a comfortable lying or sitting position and time how long it takes to feel 10 kicks, twists, turns, swishes, or rolls. Call your physician or midwife if there have not been 10 kicks in 2 hours    Call physician or midwife, return to Labor and Delivery, call 911, or go to the nearest Emergency Room if: increased leakage or fluid, contractions more than  6 per  1 hour, decreased fetal movement, persistent low back pain or cramping, bleeding from vaginal area, difficulty urinating, pain with urination, difficulty breathing, new calf pain, persistent headache or vision change.       PICK UP PRESCRIPTION AND TAKE AS DIRECTED.   KEEP ALL FOLLOW UP APPOINTMENTS.

## 2021-09-05 NOTE — Care Coordination-Inpatient (Signed)
Ambulatory Care Coordination Note    ACM attempted to reach patient for introduction to Associate Care Management related to Maternity CM. HIPAA compliant message left requesting a return phone call at patient convenience.     Plan for follow-up call in 1-2 days      Future Appointments   Date Time Provider Department Center   09/11/2021  9:30 AM Leitha Bleak Ridenbaugh, APRN - CNM [facility]       Danley Danker, BSN, RN  Associate Care Manager   Cell: [phone]  [email or web address]

## 2021-09-05 NOTE — Care Coordination-Inpatient (Signed)
OPENED IN ERROR

## 2021-09-06 NOTE — Care Coordination-Inpatient (Signed)
Ambulatory Care Coordination Note    ACM attempted 2nd outreach to patient for introduction to Associate Care Management related to Maternity CM. HIPAA compliant message left requesting a return phone call at patient convenience.     Unable to Reach Letter sent to patient via MyChart.    Will continue to outreach.      Future Appointments   Date Time Provider Department Center   09/11/2021  9:30 AM Leitha Bleak Ridenbaugh, APRN - CNM [facility]       Danley Danker, BSN, RN  Associate Care Manager   Cell: [phone]  [email or web address]

## 2021-09-11 ENCOUNTER — Ambulatory Visit: Admit: 2021-09-11 | Discharge: 2021-09-11 | Payer: BLUE CROSS/BLUE SHIELD | Attending: Advanced Practice Midwife

## 2021-09-11 DIAGNOSIS — Z3492 Encounter for supervision of normal pregnancy, unspecified, second trimester: Secondary | ICD-10-CM

## 2021-09-11 LAB — POCT URINE QUALITATIVE DIPSTICK PROTEIN: Protein, UA: NEGATIVE

## 2021-09-11 LAB — POCT URINE QUALITATIVE DIPSTICK GLUCOSE: Glucose, UA POC: NEGATIVE

## 2021-09-11 NOTE — Progress Notes (Signed)
Patient alert and pleasant with no complaints.  Here today for prenatal visit.  Fetal heart tones obtained without difficulty.  Urine for glucose and protein obtained with negative results.  Directed to the lab for ordered blood work.  Discharge instructions have been discussed with the patient. Patient advised to call our office with any questions or concerns.   Voiced understanding.

## 2021-09-11 NOTE — Progress Notes (Signed)
Lynn Frye is a 21 y.o. female [redacted]w[redacted]d    G2P1001    OB History   Gravida Para Term Preterm AB Living   2 1 1     1    SAB IAB Ectopic Molar Multiple Live Births             1      # Outcome Date GA Lbr Len/2nd Weight Sex Delivery Anes PTL Lv   2 Current            1 Term 01/20/19 [redacted]w[redacted]d  7 lb 10 oz (3.459 kg) M Vag-Spont EPI  LIV         Mother's Prenatal Vitals  BP: 96/63  Weight - Scale: 125 lb 4.8 oz (56.8 kg)  Pulse: 84  Patient Position: Sitting  Alb/Glu  Albumin: Negative  Glucose: Negative  Prenatal Fetal Information  Fetal HR: 150  Movement: Present    The patient was seen and evaluated. There was positive fetal movements. No contractions or leakage of fluid. Signs and symptoms of preterm labor as well as labor were reviewed. A 28 week lab panel was ordered. This includes a (HH, 1 hr GTT). The patient is to complete this in the next two to four weeks.    The S/S of Pre-Eclampsia were reviewed with the patient in detail. She is to report any of these if they occur. She currently denies any of these.    The patient is RH positive Rhogam Ordered no    Assessment:  1. Lynn Frye is a 21 y.o. female  2. G2P1001  3. [redacted]w[redacted]d  4. 28-week labs ordered    Patient Active Problem List    Diagnosis Date Noted    Preterm contractions 09/04/2021        Diagnosis Orders   1. Encounter for supervision of normal pregnancy in second trimester, unspecified gravidity  POCT urine qual dipstick glucose    POCT urine qual dipstick protein            Plan:  The patient will return to the office for her next visit in 2 weeks. See antepartum flow sheet.

## 2021-09-25 ENCOUNTER — Encounter: Payer: BLUE CROSS/BLUE SHIELD | Attending: Advanced Practice Midwife

## 2021-10-09 ENCOUNTER — Inpatient Hospital Stay: Payer: BLUE CROSS/BLUE SHIELD

## 2021-10-09 ENCOUNTER — Encounter

## 2021-10-09 DIAGNOSIS — Z3492 Encounter for supervision of normal pregnancy, unspecified, second trimester: Secondary | ICD-10-CM

## 2021-10-09 LAB — CBC WITH AUTO DIFFERENTIAL
Absolute Immature Granulocyte: 0.11 10*3/uL (ref 0.00–0.58)
Basophils %: 1 % (ref 0.0–2.0)
Basophils Absolute: 0.04 10*3/uL (ref 0.00–0.20)
Eosinophils %: 1 % (ref 0–6)
Eosinophils Absolute: 0.08 10*3/uL (ref 0.05–0.50)
Hematocrit: 32.3 % — ABNORMAL LOW (ref 34.0–48.0)
Hemoglobin: 10.8 g/dL — ABNORMAL LOW (ref 11.5–15.5)
Immature Granulocytes: 1 % (ref 0.0–5.0)
Lymphocytes %: 17 % — ABNORMAL LOW (ref 20.0–42.0)
Lymphocytes Absolute: 1.53 10*3/uL (ref 1.50–4.00)
MCH: 31 pg (ref 26.0–35.0)
MCHC: 33.4 g/dL (ref 32.0–34.5)
MCV: 92.8 fL (ref 80.0–99.9)
MPV: 9.2 fL (ref 7.0–12.0)
Monocytes %: 5 % (ref 2.0–12.0)
Monocytes Absolute: 0.44 10*3/uL (ref 0.10–0.95)
Neutrophils %: 75 % (ref 43.0–80.0)
Neutrophils Absolute: 6.63 10*3/uL (ref 1.80–7.30)
Platelets: 218 10*3/uL (ref 130–450)
RBC: 3.48 m/uL — ABNORMAL LOW (ref 3.50–5.50)
RDW: 12.5 % (ref 11.5–15.0)
WBC: 8.8 10*3/uL (ref 4.5–11.5)

## 2021-10-09 LAB — GLUCOSE TOLERANCE, 1 HOUR
Amount Glucose Given: 0 g
Collect Time, 1Hr Glucose: 1158
Glucose, GTT - 1 Hour: 120 mg/dL

## 2021-10-14 MED ORDER — ONDANSETRON HCL 4 MG PO TABS
4 MG | ORAL_TABLET | Freq: Three times a day (TID) | ORAL | 0 refills | Status: DC | PRN
Start: 2021-10-14 — End: 2022-02-12

## 2021-10-14 NOTE — Telephone Encounter (Signed)
Rx approved.

## 2021-10-14 NOTE — Telephone Encounter (Signed)
Vm left

## 2021-10-23 ENCOUNTER — Ambulatory Visit: Admit: 2021-10-23 | Discharge: 2021-10-23 | Payer: BLUE CROSS/BLUE SHIELD | Attending: Advanced Practice Midwife

## 2021-10-23 DIAGNOSIS — Z3A31 31 weeks gestation of pregnancy: Secondary | ICD-10-CM

## 2021-10-23 LAB — POCT URINE QUALITATIVE DIPSTICK GLUCOSE: Glucose, UA POC: NEGATIVE

## 2021-10-23 LAB — POCT URINE QUALITATIVE DIPSTICK PROTEIN: Protein, UA: POSITIVE — AB

## 2021-10-23 MED ORDER — FERROUS SULFATE 325 (65 FE) MG PO TABS
325 (65 Fe) MG | ORAL_TABLET | Freq: Two times a day (BID) | ORAL | 1 refills | Status: AC
Start: 2021-10-23 — End: ?

## 2021-10-23 MED ORDER — DOCUSATE SODIUM 100 MG PO CAPS
100 MG | ORAL_CAPSULE | Freq: Two times a day (BID) | ORAL | 0 refills | Status: AC
Start: 2021-10-23 — End: 2021-11-22

## 2021-10-23 NOTE — Progress Notes (Signed)
Galina Haddox is a 21 y.o. female [redacted]w[redacted]d    G2P1001, presents for routine ob visit, denies concerns.    OB History   Gravida Para Term Preterm AB Living   2 1 1     1    SAB IAB Ectopic Molar Multiple Live Births             1      # Outcome Date GA Lbr Len/2nd Weight Sex Delivery Anes PTL Lv   2 Current            1 Term 01/20/19 [redacted]w[redacted]d  7 lb 10 oz (3.459 kg) M Vag-Spont EPI  LIV         Mother's Prenatal Vitals  BP: 114/75  Weight - Scale: 130 lb 1.6 oz (59 kg)  Pulse: 84  Patient Position: Sitting  Alb/Glu  Albumin: Trace  Glucose: Negative  Prenatal Fetal Information  Fetal HR: 138  Movement: Present      The patient was seen and evaluated. There was positive fetal movements. No contractions or leakage of fluid. Signs and symptoms of preterm labor as well as labor were reviewed. The S/S of Pre-Eclampsia were reviewed with the patient in detail. She is to report any of these if they occur. She currently denies any of these.    The patient had her 28 week labs completed.    Assessment:  1. Luwana Butrick is a 21 y.o. female  2. G2P1001  3. [redacted]w[redacted]d  4. Hgb 10.8 with GCT testing, will begin Fe supplement, discussed bumping up Fe rich foods    Patient Active Problem List    Diagnosis Date Noted    Preterm contractions 09/04/2021        Diagnosis Orders   1. [redacted] weeks gestation of pregnancy  POCT urine qual dipstick glucose    POCT urine qual dipstick protein                Plan:  The patient will return to the office for her next visit in 2 weeks. See antepartum flow sheet.

## 2021-10-23 NOTE — Progress Notes (Signed)
Pt here for routine prenatal visit. She has no concerns. Denies bleeding/cramping/lof. Good fetal movement.

## 2021-11-06 ENCOUNTER — Ambulatory Visit: Admit: 2021-11-06 | Discharge: 2021-11-06 | Payer: BLUE CROSS/BLUE SHIELD | Attending: Advanced Practice Midwife

## 2021-11-06 DIAGNOSIS — Z3493 Encounter for supervision of normal pregnancy, unspecified, third trimester: Secondary | ICD-10-CM

## 2021-11-06 DIAGNOSIS — Z3A33 33 weeks gestation of pregnancy: Secondary | ICD-10-CM

## 2021-11-06 LAB — POCT URINE QUALITATIVE DIPSTICK GLUCOSE: Glucose, UA POC: NEGATIVE

## 2021-11-06 LAB — POCT URINE QUALITATIVE DIPSTICK PROTEIN: Protein, UA: POSITIVE — AB

## 2021-11-06 NOTE — Progress Notes (Signed)
Pt alert and pleasant. She is here today for routine prenatal visit. She has no concerns. Denies bleeding/cramping/lof. Good fetal movement.   Tdap administered into left deltoid, patient tolerated well.  Discharge instructions have been discussed with the patient. Patient advised to call our office with any questions or concerns.   Voiced understanding.

## 2021-11-06 NOTE — Progress Notes (Signed)
Lynn Frye is a 21 y.o. female [redacted]w[redacted]d    G2P1001, denies complaints today, feeling regular fetal movement.  Denies contractions/concerns.  Desires Tdap today.    OB History   Gravida Para Term Preterm AB Living   2 1 1     1    SAB IAB Ectopic Molar Multiple Live Births             1      # Outcome Date GA Lbr Len/2nd Weight Sex Delivery Anes PTL Lv   2 Current            1 Term 01/20/19 [redacted]w[redacted]d  3.459 kg (7 lb 10 oz) M Vag-Spont EPI  LIV         Mother's Prenatal Vitals  BP: 103/69  Weight - Scale: 60 kg (132 lb 3.2 oz)  Pulse: 88  Patient Position: Sitting  Alb/Glu  Albumin: Trace  Glucose: Negative  Prenatal Fetal Information  Fetal HR: 136  Movement: Present      The patient was seen and evaluated. There was positive fetal movements. No contractions or leakage of fluid. Signs and symptoms of preterm labor as well as labor were reviewed. The S/S of Pre-Eclampsia were reviewed with the patient in detail. She is to report any of these if they occur. She currently denies any of these.    The patient had her 28 week labs completed.    T-Dap Vaccine (27-36 weeks): awaiting      Assessment:  1. Lynn Frye is a 21 y.o. female  2. G2P1001  3. [redacted]w[redacted]d  4.  Tdap today    Patient Active Problem List    Diagnosis Date Noted    Preterm contractions 09/04/2021        Diagnosis Orders   1. [redacted] weeks gestation of pregnancy  POCT urine qual dipstick glucose    POCT urine qual dipstick protein                Plan:  The patient will return to the office for her next visit in 2 weeks. See antepartum flow sheet.

## 2021-11-20 ENCOUNTER — Ambulatory Visit: Admit: 2021-11-20 | Discharge: 2021-11-20 | Payer: BLUE CROSS/BLUE SHIELD | Attending: Advanced Practice Midwife

## 2021-11-20 DIAGNOSIS — Z3A35 35 weeks gestation of pregnancy: Secondary | ICD-10-CM

## 2021-11-20 LAB — POCT URINE QUALITATIVE DIPSTICK PROTEIN: Protein, UA: POSITIVE — AB

## 2021-11-20 LAB — POCT URINE QUALITATIVE DIPSTICK GLUCOSE: Glucose, UA POC: NEGATIVE

## 2021-11-20 NOTE — Progress Notes (Signed)
Lynn Frye is a 21 y.o. female [redacted]w[redacted]d    G2P1001  GBS next visit    OB History   Gravida Para Term Preterm AB Living   2 1 1     1    SAB IAB Ectopic Molar Multiple Live Births             1      # Outcome Date GA Lbr Len/2nd Weight Sex Delivery Anes PTL Lv   2 Current            1 Term 01/20/19 109w0d  3.459 kg (7 lb 10 oz) M Vag-Spont EPI  LIV         Mother's Prenatal Vitals  BP: 109/71  Weight - Scale: 59.9 kg (132 lb)  Pulse: 85  Patient Position: Sitting  Alb/Glu  Albumin: Trace  Glucose: Negative  Prenatal Fetal Information  Fetal HR: 140  Movement: Present      The patient was seen and evaluated. There was positive fetal movements. No contractions or leakage of fluid. Signs and symptoms of preterm labor as well as labor were reviewed. The S/S of Pre-Eclampsia were reviewed with the patient in detail. She is to report any of these if they occur. She currently denies any of these.    The patient had her 28 week labs completed.    T-Dap Vaccine (27-36 weeks): completed    Assessment:  1. Lynn Frye is a 21 y.o. female  2. G2P1001  3. [redacted]w[redacted]d  4. GBS next visit    Patient Active Problem List    Diagnosis Date Noted    Preterm contractions 09/04/2021        Diagnosis Orders   1. [redacted] weeks gestation of pregnancy  POCT urine qual dipstick protein    POCT urine qual dipstick glucose                Plan:  The patient will return to the office for her next visit in 1 weeks. See antepartum flow sheet.

## 2021-11-20 NOTE — Progress Notes (Unsigned)
Patient is here today for prenatal visit. Denies any vaginal bleeding, or leakage of fluids. Mild cramping on and off.   Patient reports good fetal movement.

## 2021-11-27 ENCOUNTER — Encounter

## 2021-11-27 ENCOUNTER — Ambulatory Visit: Admit: 2021-11-27 | Discharge: 2021-11-27 | Payer: BLUE CROSS/BLUE SHIELD | Attending: Advanced Practice Midwife

## 2021-11-27 DIAGNOSIS — Z3A36 36 weeks gestation of pregnancy: Secondary | ICD-10-CM

## 2021-11-27 LAB — POCT URINE QUALITATIVE DIPSTICK PROTEIN: Protein, UA: NEGATIVE

## 2021-11-27 LAB — POCT URINE QUALITATIVE DIPSTICK GLUCOSE: Glucose, UA POC: NEGATIVE

## 2021-11-27 NOTE — Progress Notes (Signed)
Lynn Frye is a 21 y.o. female [redacted]w[redacted]d    G2P1001  Doing well, GBS today    OB History   Gravida Para Term Preterm AB Living   2 1 1     1    SAB IAB Ectopic Molar Multiple Live Births             1      # Outcome Date GA Lbr Len/2nd Weight Sex Delivery Anes PTL Lv   2 Current            1 Term 01/20/19 [redacted]w[redacted]d  3.459 kg (7 lb 10 oz) M Vag-Spont EPI  LIV         Mother's Prenatal Vitals  BP: 110/76  Weight - Scale: 61.2 kg (135 lb)  Pulse: 86  Patient Position: Sitting  Alb/Glu  Albumin: Negative  Glucose: Negative  Prenatal Fetal Information  Fetal HR: 132  Movement: Present      The patient was seen and evaluated. There was positive fetal movements. No contractions or leakage of fluid. Signs and symptoms of labor were reviewed.  The S/S of Pre-Eclampsia were reviewed with the patient in detail. She is to report any of these if they occur. She currently denies any of these.    T-Dap Vaccine Completed (27-36 weeks): Yes    Allergies:  Allergies as of 11/27/2021 - Fully Reviewed 11/27/2021   Allergen Reaction Noted    Amoxicillin Hives 05/08/2021    Cefdinir Hives 05/08/2021    Cefzil [cefprozil] Hives 05/08/2021    Clindamycin/lincomycin Hives 05/08/2021         Group Beta Strep collection was completed. Yes    The patient was found to be GBS: pending    The patient was counseled on Labor & Delivery. Yes  Route of delivery and counseling on vaginal, operative vaginal, and cesarean sections were completed with the risks of each to both the patient as well as her baby. The possibility of a blood transfusion was discussed as well. The patient was not opposed to receiving a transfusion if needed. The patient was counseled on types of analgesia during labor and is considering either Regional or IV medication the risks, benefits and alternatives were discussed.       Assessment:  1. Lynn Frye is a 21 y.o. female  2. G2P1001  3. [redacted]w[redacted]d  4. GBS completed today    Patient Active Problem List    Diagnosis Date Noted     Preterm contractions 09/04/2021        Diagnosis Orders   1. [redacted] weeks gestation of pregnancy  POCT urine qual dipstick glucose    POCT urine qual dipstick protein              Plan:  The patient will return to the office for her next visit in 1 weeks. See antepartum flow sheet.

## 2021-11-27 NOTE — Progress Notes (Signed)
Pt here for routine prenatal visit. She has no concerns. Denies bleeding/cramping/lof. Good fetal movement.   Strep B culture was obtained, labeled and sent to the lab. Patient tolerated well.  Discharge instructions have been discussed with the patient. Patient advised to call our office with any questions or concerns.   Voiced understanding.

## 2021-12-01 LAB — CULTURE, STREP B SCREEN, VAGINAL/RECTAL

## 2021-12-04 ENCOUNTER — Ambulatory Visit: Admit: 2021-12-04 | Discharge: 2021-12-04 | Payer: BLUE CROSS/BLUE SHIELD | Attending: Advanced Practice Midwife

## 2021-12-04 DIAGNOSIS — Z3493 Encounter for supervision of normal pregnancy, unspecified, third trimester: Secondary | ICD-10-CM

## 2021-12-04 LAB — POCT URINE QUALITATIVE DIPSTICK GLUCOSE: Glucose, UA POC: NEGATIVE

## 2021-12-04 LAB — POCT URINE QUALITATIVE DIPSTICK PROTEIN: Protein, UA: NEGATIVE

## 2021-12-04 NOTE — Progress Notes (Unsigned)
Lynn Frye is a 21 y.o. female [redacted]w[redacted]d    G2P1001  Last baby just shy of 35 w, spontaneous labor - about 28 hours  Depo - contraception  Girl      OB History   Gravida Para Term Preterm AB Living   2 1 1     1    SAB IAB Ectopic Molar Multiple Live Births             1      # Outcome Date GA Lbr Len/2nd Weight Sex Delivery Anes PTL Lv   2 Current            1 Term 01/20/19 [redacted]w[redacted]d  3.459 kg (7 lb 10 oz) M Vag-Spont EPI  LIV         Mother's Prenatal Vitals  BP: 93/67  Weight - Scale: 60.1 kg (132 lb 8 oz)  Pulse: 85  Patient Position: Sitting  Alb/Glu  Albumin: Negative  Glucose: Negative  Prenatal Fetal Information  Fetal HR: 146  Movement: Present      The patient was seen and evaluated. There was {pos_neg:315050} fetal movements. No contractions or leakage of fluid. Signs and symptoms of labor were reviewed.  The S/S of Pre-Eclampsia were reviewed with the patient in detail. She is to report any of these if they occur. She currently {denies/complains:31533} any of these.    The patient reports that the targets have been made {YES / ZO:10960}.    T-Dap Vaccine Completed (27-36 weeks): {YES/NO:[id number]}    Allergies:  Allergies as of 12/04/2021 - Fully Reviewed 12/04/2021   Allergen Reaction Noted    Amoxicillin Hives 05/08/2021    Cefdinir Hives 05/08/2021    Cefzil [cefprozil] Hives 05/08/2021    Clindamycin/lincomycin Hives 05/08/2021         Group Beta Strep collection was completed. {yes no:315493::"Yes"}    The patient was found to be GBS: {Pos/neg/not done:15184}    Cervical Exam was:   {pe cervical exam intrapartum:314522}    The patient was counseled on the mandatory call ahead policy. She has been instructed to call the office at anytime prior to going into the hospital so the on-call physician may direct her to the appropriate facility for care. Exceptions were reviewed including but not limited to: Decreased fetal movement, vaginal Bleeding or hemorrhage, trauma, readily expectant delivery, or any  instance where she feels 911 should be utilized.      The patient was counseled on Labor & Delivery. ***  Route of delivery and counseling on vaginal, operative vaginal, and cesarean sections were completed with the risks of each to both the patient as well as her baby. The possibility of a blood transfusion was discussed as well. The patient {was/was not:[phone]} opposed to receiving a transfusion if needed. The patient was counseled on types of analgesia during labor and is considering either Regional or IV medication the risks, benefits and alternatives were discussed.       Assessment:  1. Lynn Frye is a 21 y.o. female  2. G2P1001  3. [redacted]w[redacted]d    Patient Active Problem List    Diagnosis Date Noted    Preterm contractions 09/04/2021        Diagnosis Orders   1. Encounter for supervision of normal pregnancy in third trimester, unspecified gravidity  POCT urine qual dipstick glucose    POCT urine qual dipstick protein              Plan:  The patient will return to  the office for her next visit in *** weeks. See antepartum flow sheet.

## 2021-12-04 NOTE — Progress Notes (Signed)
Patient alert and pleasant today with no complaints.  Here today for prenatal visit.  Urine for glucose and protein obtained with negative results.  Fetal heart tones obtained without difficulty.  Discharge instructions given and patient directed to call the office with any questions or concerns and verbalized understanding.

## 2021-12-11 ENCOUNTER — Ambulatory Visit: Admit: 2021-12-11 | Discharge: 2021-12-11 | Payer: BLUE CROSS/BLUE SHIELD | Attending: Advanced Practice Midwife

## 2021-12-11 DIAGNOSIS — Z3A38 38 weeks gestation of pregnancy: Secondary | ICD-10-CM

## 2021-12-11 LAB — POCT URINE QUALITATIVE DIPSTICK PROTEIN: Protein, UA: NEGATIVE

## 2021-12-11 LAB — POCT URINE QUALITATIVE DIPSTICK GLUCOSE: Glucose, UA POC: NEGATIVE

## 2021-12-11 MED ORDER — CLINDAMYCIN HCL 300 MG PO CAPS
300 MG | ORAL_CAPSULE | Freq: Three times a day (TID) | ORAL | 0 refills | Status: AC
Start: 2021-12-11 — End: 2021-12-18

## 2021-12-11 NOTE — Progress Notes (Signed)
Lynn Frye is a 21 y.o. female [redacted]w[redacted]d    G2P1001, states "feeling good," reports occasional contractions, especially at night. Does not desire induction of labor. 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 There was positive fetal movements. No contractions or leakage of fluid. Signs and symptoms of labor were reviewed.  The S/S of Pre-Eclampsia were reviewed with the patient in detail. She is to report any of these if they occur. She currently denies any of these.    T-Dap Vaccine Completed (27-36 weeks): Yes    Allergies:  Allergies as of 12/11/2021 - Fully Reviewed 12/11/2021   Allergen Reaction Noted    Amoxicillin Hives 05/08/2021    Cefdinir Hives 05/08/2021    Cefzil [cefprozil] Hives 05/08/2021    Clindamycin/lincomycin Hives 05/08/2021         Group Beta Strep collection was completed. Yes    The patient was found to be GBS: negative    The patient was counseled on Labor & Delivery. Yes  Route of delivery and counseling on vaginal, operative vaginal, and cesarean sections were completed with the risks of each to both the patient as well as her baby. The possibility of a blood transfusion was discussed as well. The patient was not opposed to receiving a transfusion if needed. The patient was counseled on types of analgesia during labor and is considering either Regional or IV medication the risks, benefits and  alternatives were discussed.       Assessment:  1. Lynn Frye is a 21 y.o. female  2. G2P1001  3. [redacted]w[redacted]d  4. GBS negative  5. Does not desire induction of labor    Patient Active Problem List    Diagnosis Date Noted    Preterm contractions 09/04/2021        Diagnosis Orders   1. [redacted] weeks gestation of pregnancy  POCT urine qual dipstick glucose    POCT urine qual dipstick protein              Plan:  The patient will return to the office for her next visit in 1 weeks. See antepartum flow sheet.

## 2021-12-11 NOTE — Progress Notes (Signed)
Pt here for routine prenatal visit. She has no concerns, denies bleeding/cramping/lof. Good fetal movement. Discharge instructions have been discussed with the patient. Patient advised to call our office with any questions or concerns.   Voiced understanding.

## 2021-12-18 ENCOUNTER — Ambulatory Visit: Admit: 2021-12-18 | Discharge: 2021-12-18 | Payer: BLUE CROSS/BLUE SHIELD | Attending: Advanced Practice Midwife

## 2021-12-18 DIAGNOSIS — Z3493 Encounter for supervision of normal pregnancy, unspecified, third trimester: Secondary | ICD-10-CM

## 2021-12-18 LAB — POCT URINE QUALITATIVE DIPSTICK GLUCOSE

## 2021-12-18 LAB — POCT URINE QUALITATIVE DIPSTICK PROTEIN

## 2021-12-18 NOTE — Progress Notes (Signed)
Patient alert and pleasant today with no complaints.  Here today for prenatal visit.  Urine for glucose and protein obtained with negative results.  Fetal heart tones obtained without difficulty.  Discharge instructions given and patient directed to call the office with any questions or concerns and verbalized understanding.

## 2021-12-18 NOTE — Progress Notes (Signed)
Lynn Frye is a 21 y.o. female [redacted]w[redacted]d    G2P1001    OB History   Gravida Para Term Preterm AB Living   2 1 1     1    SAB IAB Ectopic Molar Multiple Live Births             1      # Outcome Date GA Lbr Len/2nd Weight Sex Delivery Anes PTL Lv   2 Current            1 Term 01/20/19 [redacted]w[redacted]d  3.459 kg (7 lb 10 oz) M Vag-Spont EPI  LIV         Mother's Prenatal Vitals  BP: 105/72  Weight - Scale: 61.6 kg (135 lb 12.8 oz)  Pulse: 88  Patient Position: Sitting  Alb/Glu  Albumin: Negative  Glucose: Negative  Prenatal Fetal Information  Fetal HR: 144  Movement: Present      The patient was seen and evaluated. There was positive fetal movements. No contractions or leakage of fluid. Signs and symptoms of labor were reviewed.  The S/S of Pre-Eclampsia were reviewed with the patient in detail. She is to report any of these if they occur. She currently denies any of these.      Allergies:  Allergies as of 12/18/2021 - Fully Reviewed 12/18/2021   Allergen Reaction Noted    Amoxicillin Hives 05/08/2021    Cefdinir Hives 05/08/2021    Cefzil [cefprozil] Hives 05/08/2021    Clindamycin/lincomycin Hives 05/08/2021         Group Beta Strep collection was completed. Yes    The patient was found to be GBS: negative    Cervical Exam was:   Declines    The patient was counseled on Labor & Delivery. Yes  Route of delivery and counseling on vaginal, operative vaginal, and cesarean sections were completed with the risks of each to both the patient as well as her baby. The possibility of a blood transfusion was discussed as well. The patient was not opposed to receiving a transfusion if needed. The patient was counseled on types of analgesia during labor and is considering either Regional or IV medication the risks, benefits and alternatives were discussed.       Assessment:  1. Lynn Frye is a 21 y.o. female  2. G2P1001  3. [redacted]w[redacted]d  4.  Does not desire induction, labor precautions reviewed.    Patient Active Problem List    Diagnosis Date  Noted    Preterm contractions 09/04/2021        Diagnosis Orders   1. Encounter for supervision of normal pregnancy in third trimester, unspecified gravidity  POCT urine qual dipstick glucose    POCT urine qual dipstick protein              Plan:  The patient will return to the office for her next visit in 1 weeks. See antepartum flow sheet.

## 2021-12-21 MED FILL — DOCUSATE SODIUM 100 MG PO CAPS: 100 MG | ORAL | Qty: 1

## 2021-12-23 ENCOUNTER — Inpatient Hospital Stay
Admit: 2021-12-23 | Discharge: 2021-12-25 | Disposition: A | Discharge status: Home / Self Care | Payer: BLUE CROSS/BLUE SHIELD | Attending: Obstetrics & Gynecology | Admitting: Obstetrics & Gynecology

## 2021-12-23 DIAGNOSIS — O479 False labor, unspecified: Secondary | ICD-10-CM

## 2021-12-23 LAB — CBC
Hematocrit: 31.7 % — ABNORMAL LOW (ref 34.0–48.0)
Hemoglobin: 10.5 g/dL — ABNORMAL LOW (ref 11.5–15.5)
MCH: 27.5 pg (ref 26.0–35.0)
MCHC: 33.1 g/dL (ref 32.0–34.5)
MCV: 83 fL (ref 80.0–99.9)
MPV: 9.2 fL (ref 7.0–12.0)
Platelets: 243 10*3/uL (ref 130–450)
RBC: 3.82 m/uL (ref 3.50–5.50)
RDW: 12.6 % (ref 11.5–15.0)
WBC: 13.5 10*3/uL — ABNORMAL HIGH (ref 4.5–11.5)

## 2021-12-23 LAB — DRUG SCREEN MULTI URINE WITH BUP
Amphetamine Screen, Ur: NEGATIVE
Barbiturate Screen, Ur: NEGATIVE
Benzodiazepine Screen, Urine: NEGATIVE
Buprenorphine Urine: NEGATIVE
Cannabinoid Scrn, Ur: NEGATIVE
Cocaine Metabolite, Urine: NEGATIVE
Fentanyl, Ur: NEGATIVE
Methadone Screen, Urine: NEGATIVE
Opiates, Urine: NEGATIVE
Oxycodone Screen, Ur: NEGATIVE
Phencyclidine, Urine: NEGATIVE

## 2021-12-23 LAB — TYPE AND SCREEN
ABO/Rh: A POS
Antibody Screen: NEGATIVE

## 2021-12-23 MED ORDER — MISOPROSTOL 200 MCG PO TABS
200 MCG | ORAL | Status: AC | PRN
Start: 2021-12-23 — End: 2021-12-25

## 2021-12-23 MED ORDER — IBUPROFEN 800 MG PO TABS
800 MG | Freq: Three times a day (TID) | ORAL | Status: AC | PRN
Start: 2021-12-23 — End: 2021-12-25
  Administered 2021-12-24 – 2021-12-25 (×4): 800 mg via ORAL

## 2021-12-23 MED ORDER — TETANUS-DIPHTH-ACELL PERTUSSIS 5-2.5-18.5 LF-MCG/0.5 IM SUSY
INTRAMUSCULAR | Status: DC
Start: 2021-12-23 — End: 2021-12-23

## 2021-12-23 MED ORDER — NORMAL SALINE FLUSH 0.9 % IV SOLN
0.9 % | INTRAVENOUS | Status: AC | PRN
Start: 2021-12-23 — End: 2021-12-25

## 2021-12-23 MED ORDER — NORMAL SALINE FLUSH 0.9 % IV SOLN
0.9 % | Freq: Two times a day (BID) | INTRAVENOUS | Status: AC
Start: 2021-12-23 — End: 2021-12-25
  Administered 2021-12-24: 01:00:00 10 mL via INTRAVENOUS

## 2021-12-23 MED ORDER — LACTATED RINGERS IV BOLUS
INTRAVENOUS | Status: DC | PRN
Start: 2021-12-23 — End: 2021-12-23

## 2021-12-23 MED ORDER — DOCUSATE SODIUM 100 MG PO CAPS
100 MG | Freq: Two times a day (BID) | ORAL | Status: DC
Start: 2021-12-23 — End: 2021-12-25
  Administered 2021-12-24 – 2021-12-25 (×4): 100 mg via ORAL

## 2021-12-23 MED ORDER — SODIUM CHLORIDE 0.9 % IV SOLN
0.9 % | INTRAVENOUS | Status: DC | PRN
Start: 2021-12-23 — End: 2021-12-23

## 2021-12-23 MED ORDER — LANSINOH LANOLIN EX CREA
CUTANEOUS | Status: DC | PRN
Start: 2021-12-23 — End: 2021-12-25
  Administered 2021-12-24: 01:00:00 via TOPICAL

## 2021-12-23 MED ORDER — TRANEXAMIC ACID 1000 MG/10ML IV SOLN
1000 MG/10ML | Freq: Once | INTRAVENOUS | Status: AC | PRN
Start: 2021-12-23 — End: 2021-12-24

## 2021-12-23 MED ORDER — POVIDONE-IODINE 10 % EX SOLN
10 % | CUTANEOUS | Status: AC
Start: 2021-12-23 — End: 2021-12-24

## 2021-12-23 MED ORDER — ONDANSETRON HCL 4 MG/2ML IJ SOLN
4 MG/2ML | Freq: Four times a day (QID) | INTRAMUSCULAR | Status: DC | PRN
Start: 2021-12-23 — End: 2021-12-23

## 2021-12-23 MED ORDER — ACETAMINOPHEN 500 MG PO TABS
500 MG | Freq: Three times a day (TID) | ORAL | Status: AC | PRN
Start: 2021-12-23 — End: 2021-12-25
  Administered 2021-12-24 – 2021-12-25 (×2): 1000 mg via ORAL

## 2021-12-23 MED ORDER — NORMAL SALINE FLUSH 0.9 % IV SOLN
0.9 % | Freq: Two times a day (BID) | INTRAVENOUS | Status: DC
Start: 2021-12-23 — End: 2021-12-23

## 2021-12-23 MED ORDER — CARBOPROST TROMETHAMINE 250 MCG/ML IM SOLN
250 MCG/ML | INTRAMUSCULAR | Status: AC | PRN
Start: 2021-12-23 — End: 2021-12-25

## 2021-12-23 MED ORDER — LIDOCAINE HCL 1 % IJ SOLN
1 % | INTRAMUSCULAR | Status: AC
Start: 2021-12-23 — End: 2021-12-24

## 2021-12-23 MED ORDER — DOCUSATE SODIUM 100 MG PO CAPS
100 MG | Freq: Two times a day (BID) | ORAL | Status: DC
Start: 2021-12-23 — End: 2021-12-23

## 2021-12-23 MED ORDER — LACTATED RINGERS IV SOLN
INTRAVENOUS | Status: DC
Start: 2021-12-23 — End: 2021-12-25

## 2021-12-23 MED ORDER — BENZOCAINE-MENTHOL 20-0.5 % EX AERO
20-0.5 % | CUTANEOUS | Status: AC | PRN
Start: 2021-12-23 — End: 2021-12-25
  Administered 2021-12-24: 01:00:00 via TOPICAL

## 2021-12-23 MED ORDER — NORMAL SALINE FLUSH 0.9 % IV SOLN
0.9 % | INTRAVENOUS | Status: DC | PRN
Start: 2021-12-23 — End: 2021-12-23

## 2021-12-23 MED ORDER — OXYTOCIN 0.06 UNIT/ML BOLUS FROM THE BAG (POST-PARTUM)
30 | INTRAVENOUS | Status: DC | PRN
Start: 2021-12-23 — End: 2021-12-25

## 2021-12-23 MED ORDER — ACETAMINOPHEN 325 MG PO TABS
325 MG | ORAL | Status: DC | PRN
Start: 2021-12-23 — End: 2021-12-23

## 2021-12-23 MED ORDER — METHYLERGONOVINE MALEATE 0.2 MG/ML IJ SOLN
0.2 MG/ML | INTRAMUSCULAR | Status: AC | PRN
Start: 2021-12-23 — End: 2021-12-25

## 2021-12-23 MED ORDER — NALOXONE HCL 0.4 MG/ML IJ SOLN
0.4 MG/ML | INTRAMUSCULAR | Status: DC | PRN
Start: 2021-12-23 — End: 2021-12-23

## 2021-12-23 MED ORDER — FENTANYL AND BUPIVACAINE (OB) INFUSION 135 ML (HOME CARE)
Status: AC
Start: 2021-12-23 — End: 2021-12-23

## 2021-12-23 MED ORDER — OXYTOCIN 15 UNITS IN 250 ML INFUSION
15 UNIT/250ML | INTRAVENOUS | Status: AC | PRN
Start: 2021-12-23 — End: 2021-12-25

## 2021-12-23 MED ORDER — ONDANSETRON HCL 4 MG/2ML IJ SOLN
42 MG/2ML | Freq: Four times a day (QID) | INTRAMUSCULAR | Status: AC | PRN
Start: 2021-12-23 — End: 2021-12-25

## 2021-12-23 MED ORDER — FENTANYL AND BUPIVACAINE (OB) INFUSION 135 ML (HOME CARE)
Status: AC
Start: 2021-12-23 — End: 2021-12-25
  Administered 2021-12-23: 17:00:00 5 mL/h via EPIDURAL
  Administered 2021-12-23: 17:00:00 15 mL/h via EPIDURAL

## 2021-12-23 MED FILL — FENTANYL AND BUPIVACAINE (OB) INFUSION 135 ML (HOME CARE): 10 MG/ML | INTRAMUSCULAR | Qty: 135

## 2021-12-23 MED FILL — BETADINE 10 % EX SOLN: 10 % | CUTANEOUS | Qty: 118

## 2021-12-23 MED FILL — LIDOCAINE HCL 1 % IJ SOLN: 1 % | INTRAMUSCULAR | Qty: 20

## 2021-12-23 NOTE — Plan of Care (Signed)
Problem: Vaginal Birth or Cesarean Section  Goal: Fetal and maternal status remain reassuring during the birth process  Description:  Birth OB-Pregnancy care plan goal which identifies if the fetal and maternal status remain reassuring during the birth process  Outcome: Progressing     Problem: Pain  Goal: Verbalizes/displays adequate comfort level or baseline comfort level  Outcome: Progressing  Flowsheets (Taken 12/23/2021 1959)  Verbalizes/displays adequate comfort level or baseline comfort level: Assess pain using appropriate pain scale

## 2021-12-23 NOTE — Progress Notes (Signed)
Patient reported that right side of her body is NOT numb like the left side is.  Patient was positioned on her right side to see if that would help it did not anesthesia called for further eval.

## 2021-12-23 NOTE — Lactation Note (Signed)
Experienced mom reports baby latched well initially, in nursery at this time. Encouraged skin to skin and frequent attempts at breast to stimulate milk production. Instructed on normal infant behavior in the first 12-24 hours and importance of stimulating the baby frequently to eat during this time. Reviewed hand expression, and encouraged to hand express drops of colostrum when baby is sleepy. Instructed that baby may also feed 8-12 times a day- cluster feeding at times- as her milk supply is being established.  Instructed on benefits of skin to skin and avoidance of pacifier / artificial nipple use until breastfeeding is well established.  Educated on making sure infant has an open airway while breastfeeding and skin to skin. Instructed on hunger cues and waking techniques to try. Reviewed signs of adequate I & O; allow baby to feed ad lib and not to limit time at breast. Breastfeeding booklet provided with review of its contents. Encouraged to call with any concerns. Mom has a breast pump for home use.

## 2021-12-23 NOTE — Other (Signed)
Patient helped to bathroom.  Voided large amount without difficulty.  Peri care given and explained.  Ice cont. To perineum. Assisted back to bed.  Tolerated ambulating well.

## 2021-12-23 NOTE — Procedures (Signed)
Delivery Note    normal spontaneous vaginal delivery of a  Female infant (weight pending), Apgars 8/9, at 1452, in rightocciput anterior position under epidural.  Nuchal cord was not present.   Normal placenta was delivered spontaneously and was noted to be intact with 3v cord.  1st degree labial laceration repaired under local anesthesia with 3-0 Chromic with 2 interrupted stitches.  No perineal or cervical lacerations present. EBL 250 ml. Cord gases and blood collected. Mother stable, infant stable to general nursery.

## 2021-12-23 NOTE — Other (Signed)
Patient admitted into room, oriented to surroundings and admission paperwork. Parents given the [facility] informational sheet, and contents explained. Educated parents on safe sleep practices for baby. Instructed to call for the first time up to bathroom. Fundus midline, firm and palpated at U/-1. Small amount of rubra lochia noted with no clots. Patient denies any pain or dizziness while sitting up in bed. Vital signs stable upon admission to the unit. Phone at patient's side, instructed to use it for any needs.

## 2021-12-23 NOTE — Progress Notes (Signed)
AROM with moderate amount of meconium stained fluid.    SVE 6/90/0  FHT 135 bpm, moderate variability, +accels, no decels  Toco q 2-5 min    Continue expectant mgmt.

## 2021-12-23 NOTE — H&P (Addendum)
Department of Obstetrics and Gynecology  Labor and Delivery   Obstetrics History and Physical      Patient Name: Lynn Frye  Date of Birth: [birth year]   MRN: [id number]    CHIEF COMPLAINT:  contractions    HISTORY OF PRESENT ILLNESS:    The patient is a 21 y.o. female, G2 P1 A0, Estimated Date of Delivery: 12/25/21, EGA: 39 and 5/7 weeks presents to L&D  home with contractions since 5 am.  Patient denies leakage of fluid, vaginal bleeding, contractions, cramping, swelling, RUQ or epigastric pain, headache, visual changes, or urinary symptoms.  Perceived fetal movement is good.    Her current obstetrical history is significant for: uncomplicated     Lab Review  A+/Ab negative  Rubella immune   Hep B NR  HIV NR  RPR NR   Hep C NR   GBS: negative    PAST OB HISTORY  OB History   Gravida Para Term Preterm AB Living   2 1 1     1    SAB IAB Ectopic Molar Multiple Live Births             1      # Outcome Date GA Lbr Len/2nd Weight Sex Delivery Anes PTL Lv   2 Current            1 Term 01/20/19 [redacted]w[redacted]d  3.459 kg (7 lb 10 oz) M Vag-Spont EPI  LIV       Past Medical History:    Past Medical History:   Diagnosis Date    Asthma         Past Surgical History:    History reviewed. No pertinent surgical history.     Medications Prior to Admission:  Medications Prior to Admission: ferrous sulfate (IRON 325) 325 (65 Fe) MG tablet, Take 1 tablet by mouth 2 times daily  ondansetron (ZOFRAN) 4 MG tablet, Take 1 tablet by mouth 3 times daily as needed for Nausea or Vomiting  Prenatal Vit-Fe Fumarate-FA (PRENATAL 1+1 PO), Take by mouth    Allergies:  Amoxicillin, Cefdinir, Cefzil [cefprozil], and Clindamycin/lincomycin    Social History:  Denies tobacco, alcohol, or drug use     Family History   Problem Relation Age of Onset    Heart Attack Father        Blood type: No results found for: "LABABO", "LABRH"  Antibody screen: No results found for: "LABANTI"    CBC:   Lab Results   Component Value Date    WBC 8.8 10/09/2021    HGB 10.8 (L)  10/09/2021    HCT 32.3 (L) 10/09/2021    MCV 92.8 10/09/2021    PLT 218 10/09/2021     in CBC    Review of Systems:   ROS:  Const: No fever, chills, night sweats, no recent unexplained weight gain/loss  HEENT: No blurred vision, double vision; no ear problems; no sore throat, congestion; no running nose.  Resp: No cough, no sputum, no pleuritic chest pain, no sob  Cardio: No chest pain, no exertional dyspnea, no PND, no orthopnea, no palpitation, no leg swelling.   GI: No dysphagia, no reflux; no abdominal pain, no n/v; no c/d. No hematochezia    GU: No dysuria, no frequency, hesitancy; no hematuria  MSK: no joint pain, no myalgia, no change in ROM  Neuro: no focal weakness in extremities, no slurred speech, no double vision, no numbness or tingling in extremities  Endo: no heat/cold intolerance, no polyphagia, polydipsia or  polyuria  Hem: no increased bleeding, no bruising, no lymphadenopathy  Skin: no skin changes  Psych: no depressed mood, no suicidal ideation    PHYSICAL EXAM:No LMP recorded (lmp unknown). Patient is pregnant.  BP 126/66   Pulse 80   Temp 97.7 F (36.5 C) (Oral)   Resp 18   LMP  (LMP Unknown)      Patient Vitals for the past 8 hrs:   BP Temp Temp src Pulse Resp   12/23/21 0825 126/66 97.7 F (36.5 C) Oral 80 18         General Appearance: awake, alert, cooperative, no apparent distress, and appears stated age  Lungs: clear to auscultation  Heart: regular rate and rhythm and no murmur noted  Abdomen: soft, non-tender,   Gravid at 39 and 5/7 weeks   Uterine contractions: every 5 minutes   Uterus soft, non-tender.   Fetal heart rate:  Baseline heart rate 140, variability: moderate, accelerations: present, decelerations: absent  Back: no costal vertebral tenderness  Pelvic Exam:     Cervix: 3/50/-2  Deep Tendon Reflexes:  Knees: not elevated    Membranes: Intact      Labs:       ASSESSMENT AND PLAN:  21 y.o. female, G2 P1 A0 at 2 and 5/7 weeks of gestation p/w contractions.    Plan:     Observation  EFM  Recheck in 2 hours     Discussed with Dr. Hart Carwin.    Electronically signed by Floy Sabina, MD on 12/23/2021 at 8:43 AM

## 2021-12-23 NOTE — Progress Notes (Signed)
Dr Cleaster Corin updated on sve 5cm now, states she will place orders and pt transferred to l&d. Report to michelle.

## 2021-12-24 LAB — CBC
Hematocrit: 29.7 % — ABNORMAL LOW (ref 34.0–48.0)
Hemoglobin: 9.7 g/dL — ABNORMAL LOW (ref 11.5–15.5)
MCH: 27.8 pg (ref 26.0–35.0)
MCHC: 32.7 g/dL (ref 32.0–34.5)
MCV: 85.1 fL (ref 80.0–99.9)
MPV: 9.7 fL (ref 7.0–12.0)
Platelets: 248 10*3/uL (ref 130–450)
RBC: 3.49 m/uL — ABNORMAL LOW (ref 3.50–5.50)
RDW: 12.9 % (ref 11.5–15.0)
WBC: 12.9 10*3/uL — ABNORMAL HIGH (ref 4.5–11.5)

## 2021-12-24 MED FILL — LANSINOH LANOLIN EX CREA: 4 MG/ML | CUTANEOUS | Qty: 7

## 2021-12-24 MED FILL — DERMOPLAST 20-0.5 % EX AERO: CUTANEOUS | Qty: 78

## 2021-12-24 MED FILL — DOCUSATE SODIUM 100 MG PO CAPS: 100 MG | ORAL | Qty: 1

## 2021-12-24 MED FILL — MONOJECT FLUSH SYRINGE 0.9 % IV SOLN: 0.9 % | INTRAVENOUS | Qty: 10

## 2021-12-24 MED FILL — ACETAMINOPHEN EXTRA STRENGTH 500 MG PO TABS: 500 MG | ORAL | Qty: 2

## 2021-12-24 MED FILL — IBUPROFEN 800 MG PO TABS: 800 MG | ORAL | Qty: 1

## 2021-12-24 NOTE — Lactation Note (Signed)
Mom continues to exclusively breastfeed her baby with no complaints.

## 2021-12-24 NOTE — Anesthesia Post-Procedure Evaluation (Signed)
Department of Anesthesiology  Postprocedure Note    Patient: Lynn Frye  MRN: [id number]  [place]: 11-Feb-2000  Date of evaluation: 12/24/2021      Procedure Summary       Date: 12/23/21 Room / Location:     Anesthesia Start: 1155 Anesthesia Stop: 1452    Procedure: Labor Analgesia Diagnosis:     Scheduled Providers:  Responsible Provider: Signa Kell, MD    Anesthesia Type: general, spinal, epidural ASA Status: 2            Anesthesia Type: epidural    Aldrete Phase I: Aldrete Score: 10    Aldrete Phase II:        Anesthesia Post Evaluation    Patient location during evaluation: bedside  Patient participation: complete - patient participated  Level of consciousness: awake and alert  Pain score: 5 (d/t cramping, discussed pain management and communication with nurse regarding pain.)  Airway patency: patent  Nausea & Vomiting: no vomiting and no nausea  Complications: no  Cardiovascular status: hemodynamically stable  Respiratory status: acceptable  Hydration status: stable

## 2021-12-24 NOTE — Progress Notes (Signed)
Universal Newborn Hearing screening results were discussed with parent. Questions answered. Brochure given to parent. Advised to monitor developmental milestones and contact physician for any concerns.   Tej Murdaugh J Laker Thompson, AuD

## 2021-12-24 NOTE — Plan of Care (Signed)
Problem: Discharge Planning  Goal: Discharge to home or other facility with appropriate resources  Outcome: Progressing     Problem: Vaginal Birth or Cesarean Section  Goal: Fetal and maternal status remain reassuring during the birth process  Description:  Birth OB-Pregnancy care plan goal which identifies if the fetal and maternal status remain reassuring during the birth process  Outcome: Progressing     Problem: Pain  Goal: Verbalizes/displays adequate comfort level or baseline comfort level  Outcome: Progressing  Flowsheets (Taken 12/24/2021 2217)  Verbalizes/displays adequate comfort level or baseline comfort level: Assess pain using appropriate pain scale

## 2021-12-24 NOTE — Progress Notes (Signed)
PPD #1    Patient:  Lynn Frye     Admit Date:  12/23/2021  8:16 AM  Medical Record Number:  [id number]   Today's Date: 12/24/2021    S: No complaints; tolerating diet: affirms; ambulating well: affirms; voiding without difficulty:  affirms; bm: no; flatus: affirms; pain meds appropriate: affirms; vaginal bleeding: moderate lochia.    O:   Vitals:    12/23/21 1930 12/23/21 2326 12/24/21 0336 12/24/21 0631   BP: (!) 103/58 (!) 98/54 (!) 97/54 108/63   Pulse: 79 75 (!) 14 71   Resp: 18 16 14 16    Temp: 97.8 F (36.6 C) 98.1 F (36.7 C) (!) 78 F (25.6 C) 97.6 F (36.4 C)   TempSrc: Oral Oral Oral Oral   SpO2: 97% 97% 97% 97%   Weight:       Height:         Gen: A&O, cooperative, pleasant   Heart: RRR   Lungs: CTA b/l   Abd; soft, NT, non distended, +BS  Back: no CVA or paraspinal tenderness   Ext: No clubbing, cyanosis, edema:trace , no cords palpable, no calf tenderness   Neuro: intact   Uterus: firm, well contracted, nt   Perineum: intact, healing well   Peri pad: moderate lochia    Lab Results   Component Value Date    HGB 10.5 (L) 12/23/2021    HCT 31.7 (L) 12/23/2021      Recent Results (from the past 72 hour(s))   Drug Screen Multi Urine With Bup    Collection Time: 12/23/21 10:30 AM   Result Value Ref Range    Amphetamine Screen, Ur NEGATIVE NEGATIVE    Barbiturate Screen, Ur NEGATIVE NEGATIVE    Cocaine Metabolite, Urine NEGATIVE NEGATIVE    Benzodiazepine Screen, Urine NEGATIVE NEGATIVE    Buprenorphine Urine NEGATIVE NEGATIVE    Methadone Screen, Urine NEGATIVE NEGATIVE    Opiates, Urine NEGATIVE NEGATIVE    Phencyclidine, Urine NEGATIVE NEGATIVE    Cannabinoid Scrn, Ur NEGATIVE NEGATIVE    Fentanyl, Ur NEGATIVE NEGATIVE    Oxycodone Screen, Ur NEGATIVE NEGATIVE    Test Information       These drug screen results are for medical purposes only and should not be considered definitive or confirmed.   CBC    Collection Time: 12/23/21 10:35 AM   Result Value Ref Range    WBC 13.5 (H) 4.5 - 11.5 k/uL    RBC  3.82 3.50 - 5.50 m/uL    Hemoglobin 10.5 (L) 11.5 - 15.5 g/dL    Hematocrit 14/11/23 (L) 34.0 - 48.0 %    MCV 83.0 80.0 - 99.9 fL    MCH 27.5 26.0 - 35.0 pg    MCHC 33.1 32.0 - 34.5 g/dL    RDW 46.8 03.2 - 12.2 %    Platelets 243 130 - 450 k/uL    MPV 9.2 7.0 - 12.0 fL   TYPE AND SCREEN    Collection Time: 12/23/21 10:35 AM   Result Value Ref Range    Blood Bank Sample Expiration 12/26/2021,2359     Arm Band Number 12/28/2021     ABO/Rh A POSITIVE     Antibody Screen NEGATIVE        A: 21 y.o. female G2P2002 at [redacted]w[redacted]d weeks  PPD #1 S/P NSVD; Episiotomy was not needed due to a spontaneous 1st degree (mucosa only) vaginal laceration  Patient Active Problem List   Diagnosis    Preterm contractions  Uterine contractions       P: Routine PP care.   Home tomorrow   Repeat cbc today    Chauncey Cruel, APRN - CNP    12/24/2021 8:06 AM

## 2021-12-25 ENCOUNTER — Encounter

## 2021-12-25 ENCOUNTER — Encounter: Payer: BLUE CROSS/BLUE SHIELD | Attending: Advanced Practice Midwife

## 2021-12-25 LAB — C.TRACHOMATIS N.GONORRHOEAE DNA, URINE
C. trachomatis DNA ,Urine: NEGATIVE
N. gonorrhoeae DNA, Urine: NEGATIVE

## 2021-12-25 MED ORDER — IBUPROFEN 800 MG PO TABS
800 MG | ORAL_TABLET | Freq: Three times a day (TID) | ORAL | 3 refills | Status: AC | PRN
Start: 2021-12-25 — End: ?
  Filled 2021-12-25: qty 90, 30d supply, fill #0

## 2021-12-25 MED FILL — IBUPROFEN 800 MG PO TABS: 800 MG | ORAL | Qty: 1

## 2021-12-25 MED FILL — DOCUSATE SODIUM 100 MG PO CAPS: 100 MG | ORAL | Qty: 1

## 2021-12-25 MED FILL — ACETAMINOPHEN EXTRA STRENGTH 500 MG PO TABS: 500 MG | ORAL | Qty: 2

## 2021-12-25 NOTE — Discharge Summary (Signed)
Obstetric Discharge Summary    Patient Name:  Lynn Frye    Medical Record Number:  [id number]    Attending:  Chauncey Cruel, APRN - CNP    Date of Admission:  12/23/2021    Date of Discharge:        Admitting Diagnosis  IUP 39.5 weeks  OB History       Gravida   2    Para   2    Term   2    Preterm        AB        Living   2         SAB        IAB        Ectopic        Molar        Multiple   0    Live Births   2                Reasons for Admission on 12/23/2021  8:16 AM  Uterine contractions [O47.9]  No comment available  Onset of Labor    Intrapartum Procedures   Spontaneous Vaginal Delivery: See Labor and Delivery Summary       Postpartum/Operative Complications       Newborn Data  Information for the patient's newborn:  Mckaila, Duffus [[id number]]   female   Birth Weight: 3.32 kg (7 lb 5.1 oz)   Discharge With Mother  Complications: No    Discharge Diagnosis         Discharge Meds:       Medication List        START taking these medications      ibuprofen 800 MG tablet  Commonly known as: ADVIL;MOTRIN  Take 1 tablet by mouth every 8 hours as needed for Pain            CONTINUE taking these medications      ferrous sulfate 325 (65 Fe) MG tablet  Commonly known as: IRON 325  Take 1 tablet by mouth 2 times daily     ondansetron 4 MG tablet  Commonly known as: ZOFRAN  Take 1 tablet by mouth 3 times daily as needed for Nausea or Vomiting     PRENATAL 1+1 PO               Where to Get Your Medications        These medications were sent to [place] [place] - F [phone]  [address] [place] [address]      Phone: [phone]   ibuprofen 800 MG tablet         Discharge Information  Current Discharge Medication List        START taking these medications    Details   ibuprofen (ADVIL;MOTRIN) 800 MG tablet Take 1 tablet by mouth every 8 hours as needed for Pain  Qty: 90 tablet, Refills: 3           CONTINUE these medications which have NOT  CHANGED    Details   ferrous sulfate (IRON 325) 325 (65 Fe) MG tablet Take 1 tablet by mouth 2 times daily  Qty: 180 tablet, Refills: 1    Associated Diagnoses: [redacted] weeks gestation of pregnancy; Iron deficiency anemia secondary to inadequate dietary iron intake      ondansetron (ZOFRAN) 4 MG tablet Take 1 tablet by mouth 3 times  daily as needed for Nausea or Vomiting  Qty: 30 tablet, Refills: 0    Associated Diagnoses: Second trimester pregnancy; Nausea and vomiting in pregnancy      Prenatal Vit-Fe Fumarate-FA (PRENATAL 1+1 PO) Take by mouth             No discharge procedures on file.    Discharge to: Home  Follow up in 6 weeks at [facility].     Discharge Date: 12/25/21 Time: 0800      Comments

## 2021-12-25 NOTE — Lactation Note (Addendum)
Encouraged frequent feeds to establish milk supply. Reviewed benefits and safety of skin to skin. Inst on adequate I/O and importance of keeping track of diapers at home. Instructed on signs of dehydration such as infant refusing to feed, decreased wet diapers and infant becoming listless and notify provider if these occur.  Lactation office # given if follow-up needed, as well as support group information. Encouraged to call with any concerns. Support and encouragement given.

## 2021-12-25 NOTE — Progress Notes (Signed)
[place] discharge videos completed.  Discharge highlights reviewed with pt-no further questions.

## 2021-12-25 NOTE — Discharge Instructions (Signed)
Tub bath in 6 weeks  You may take a shower  Driving 2-3 weeks  Sexual activity 6 weeks  Work/School  6 weeks  Walking  As tolerated  Stairs as tolerated  Lifting no heavy lifting Follow-up with your OB doctor in 1 week if c-section delivery or in  6 weeks for vaginal delivery unless otherwise instructed.   Call office for an appointment.    For breastfeeding support, you can contact our lactation specialists at [phone], [phone], or [phone].    DIET  Eat a well balanced diet focusing on foods high in fiber and protein  Drink plenty of fluids especially water.  To avoid constipation you may take a mild stool softener as recommended by your doctor or midwife.    ACTIVITY  Gradually increase your activity.  Resume exercise regimen only after advised by your doctor or midwife.  Avoid lifting anything heavier than your baby or a gallon of milk for SIX weeks.   Avoid driving until your doctor or midwife has given their approval.  Rise slowly from a lying to sitting and then a standing position.  Climb stairs one at a time.  Use caution when carrying your baby up and down the stairs.  No sexual activity for 6 weeks or until advised by your doctor - Nothing in vagina: intercourse, tampons, or douching.   Be prepared to discuss family planning at your follow-up OB visit.   You may feel tired or have a lack of energy.  You may continue your prenatal vitamin to replenish nutrients post delivery.  Nap when baby naps to catch up on sleep.  May return to work or school in 6 weeks or as directed by OB.     EMOTIONS  You may feed moody, sad, teary, & overwhelmed.  Contact your OB provider if you feel you may be showing signs of postpartum depression, or have thoughts of harming yourself or your infant.  If infant will not stop crying, contact another adult for help or place infant in their crib on their back and take a break.  NEVER shake your infant.      BLEEDING  Vaginal bleeding will decrease in amount over the  next few weeks.  You will notice that as your activity increases, your flow may increase.  This is your body's way of telling you, you need to take things easier and rest more often.  Call your OB/ER if you are saturating more than one maxi pad in an hour.    BREAST CARE  Take medications as recommended by your doctor or midwife for pain  If you develop a warm, red, tender area on your breast or develop a fever contact your OB provider.    For breastfeeding moms:  If you become engorged, feeding may be more difficult or painful for 1-2 days.  You may find it helpful to hand express some milk so that the infant can latch on more easily.   While breastfeeding, continue to take your prenatal vitamins as directed by your doctor or midwife.   Only take medications verified as safe for breastfeeding.    For non-breastfeeding moms:  You may apply ice packs to your breasts over your bra for twenty minutes at a time for comfort.  Avoid stimulation to your breasts, when showering allow the water to strike your back not your breasts.    Wear a good fitting bra until your milk dries, such as a sports bra.    INCISIONAL CARE /   PERI CARE  Clean your incision in the shower with mild soap.  After shower pat the incision area dry and leave open to air.  If used, Steri-stipes should be removed by 2 weeks.  If used, Staples should be removed by the OB in office by 1 week.  If used/ordered, an abdominal binder may provide support for your incision.   Use the peri-bottle after toileting until bleeding stops.  Cleanse your perineum from front to back  If used, stitches or internal clips will dissolve in 4-6 weeks.  You may use a sitz bath or soak in a clean tub as needed for comfort.  Kegel exercises will help restore bladder control.     SWELLING  Keep your legs elevated when sitting or lying.   When wearing stocking or socks, make sure they are not too tight.    WHEN TO CALL THE DOCTOR  If you have a temp of 100.4 or more.   If your  bleeding has increased and you are saturating a pad in an hour.  Your abdomen is tender to touch.  You are passing blood clots bigger than the size of a lemon.  If you are experiencing extreme weakness or dizziness.   If you are having flu-like symptoms such as achy muscles or joints.  There is a foul smell or a green color to your vaginal bleeding.  If you have pain that cannot be relieved.  You have persistent burning with urination or frequency.   Call if you have concerns about your well-being.  You are unable to sleep, eat, or are having thoughts of harming yourself or your baby.   You have swelling, bleeding, drainage, foul odor, redness, or warmth in/around your incision or stitches.  You have a red, warm, tender area in you calf.

## 2021-12-25 NOTE — Progress Notes (Signed)
PPD #2     Patient:  Lynn Frye     Admit Date:  12/23/2021  8:16 AM  Medical Record Number:  [id number]   Today's Date: 12/25/2021    S: No complaints; tolerating diet: affirms ; ambulating well: affirms; voiding without difficulty:  affirms; bm: affirms; flatus: affirms; pain meds appropriate: affirms; vaginal bleeding: thin lochia.    O:   Vitals:    12/24/21 0336 12/24/21 0631 12/24/21 2353 12/25/21 0733   BP: (!) 97/54 108/63 (!) 104/57 (!) 98/52   Pulse: (!) 14 71 77 85   Resp: 14 16 16 18    Temp: (!) 78 F (25.6 C) 97.6 F (36.4 C) 98 F (36.7 C) 97.6 F (36.4 C)   TempSrc: Oral Oral Oral Oral   SpO2: 97% 97% 98% 96%   Weight:       Height:         Gen: A&O, cooperative, pleasant   Heart: RRR   Lungs: CTA b/l   Abd; soft, NT, non distended, +BS  Back: no CVA or paraspinal tenderness   Ext: No clubbing, cyanosis, edema:trace , no cords palpable, no calf tenderness   Neuro: intact   Uterus: firm, well contracted, nt   Perineum: intact, healing well   Peri pad: thin lochia    Lab Results   Component Value Date    HGB 9.7 (L) 12/24/2021    HCT 29.7 (L) 12/24/2021      Recent Results (from the past 72 hour(s))   Drug Screen Multi Urine With Bup    Collection Time: 12/23/21 10:30 AM   Result Value Ref Range    Amphetamine Screen, Ur NEGATIVE NEGATIVE    Barbiturate Screen, Ur NEGATIVE NEGATIVE    Cocaine Metabolite, Urine NEGATIVE NEGATIVE    Benzodiazepine Screen, Urine NEGATIVE NEGATIVE    Buprenorphine Urine NEGATIVE NEGATIVE    Methadone Screen, Urine NEGATIVE NEGATIVE    Opiates, Urine NEGATIVE NEGATIVE    Phencyclidine, Urine NEGATIVE NEGATIVE    Cannabinoid Scrn, Ur NEGATIVE NEGATIVE    Fentanyl, Ur NEGATIVE NEGATIVE    Oxycodone Screen, Ur NEGATIVE NEGATIVE    Test Information       These drug screen results are for medical purposes only and should not be considered definitive or confirmed.   CBC    Collection Time: 12/23/21 10:35 AM   Result Value Ref Range    WBC 13.5 (H) 4.5 - 11.5 k/uL    RBC  3.82 3.50 - 5.50 m/uL    Hemoglobin 10.5 (L) 11.5 - 15.5 g/dL    Hematocrit 31.7 (L) 34.0 - 48.0 %    MCV 83.0 80.0 - 99.9 fL    MCH 27.5 26.0 - 35.0 pg    MCHC 33.1 32.0 - 34.5 g/dL    RDW 12.6 11.5 - 15.0 %    Platelets 243 130 - 450 k/uL    MPV 9.2 7.0 - 12.0 fL   TYPE AND SCREEN    Collection Time: 12/23/21 10:35 AM   Result Value Ref Range    Blood Bank Sample Expiration 12/26/2021,2359     Arm Band Number [id number]     ABO/Rh A POSITIVE     Antibody Screen NEGATIVE    CBC    Collection Time: 12/24/21  8:34 AM   Result Value Ref Range    WBC 12.9 (H) 4.5 - 11.5 k/uL    RBC 3.49 (L) 3.50 - 5.50 m/uL    Hemoglobin 9.7 (L) 11.5 - 15.5  g/dL    Hematocrit 38.3 (L) 34.0 - 48.0 %    MCV 85.1 80.0 - 99.9 fL    MCH 27.8 26.0 - 35.0 pg    MCHC 32.7 32.0 - 34.5 g/dL    RDW 29.1 91.6 - 60.6 %    Platelets 248 130 - 450 k/uL    MPV 9.7 7.0 - 12.0 fL       A: 21 y.o. female G2P2002 at [redacted]w[redacted]d weeks  PPD #2 S/P NSVD; Episiotomy was not needed due to a spontaneous 1st degree (mucosa only) vaginal laceration  Patient Active Problem List   Diagnosis    Preterm contractions    Uterine contractions       P: Routine PP care.   Discharge home with instructions, precautions.  Prescription for Ibuprofen 800 mg.  Continue PNV with Iron daily.  Follow-up office 6 weeks for postpartum visit.    Norva Karvonen, APRN - CNP    12/25/2021 7:37 AM

## 2021-12-25 NOTE — Progress Notes (Signed)
Mother and baby discharged via WC in stable condition.

## 2021-12-25 NOTE — Progress Notes (Signed)
CLINICAL PHARMACY NOTE: MEDS TO BEDS    Total # of Prescriptions Filled: 1   The following medications were delivered to the patient:  IBU 800 mg    Additional Documentation:

## 2021-12-27 MED ORDER — BUPROPION HCL ER (XL) 150 MG PO TB24
150 MG | ORAL_TABLET | Freq: Every morning | ORAL | 3 refills | Status: AC
Start: 2021-12-27 — End: ?

## 2021-12-27 MED ORDER — SERTRALINE HCL 25 MG PO TABS
25 MG | ORAL_TABLET | Freq: Every day | ORAL | 3 refills | Status: AC
Start: 2021-12-27 — End: ?

## 2021-12-27 NOTE — Telephone Encounter (Addendum)
Rx sent. CLR    Kennieth Rad, LPN 07/37/1062  6:94 PM EST      ----- Message -----  From: Emmit Alexanders  Sent: 12/25/2021  12:06 PM EST  To: [facility] Clinical Staff  Subject: Postpartum                                       Hi! Baby and I are being discharged today and I just wanted to follow up on seeing if we could start up the zoloft and wellbutrin again, I know we had discussed it around 35 weeks and I meant to remind you last week but totally forgot. Thanks!

## 2022-01-09 NOTE — Care Coordination-Inpatient (Signed)
ACM reviewed this patient's records r/t [facility]. Patient was UTR x3. ACM will end program and remove self from CT. No further outreach scheduled with this ACM, ACM will sign off care team at this time. Program status updated to reflect closed.  Patient  has this ACM's contact information if future needs arise.        Griffith Citron, BSN, RN  Associate Care Manager   Cell: [phone]  [email or web address]

## 2022-02-12 ENCOUNTER — Other Ambulatory Visit: Admit: 2022-02-12 | Discharge: 2022-02-12 | Payer: BLUE CROSS/BLUE SHIELD | Attending: Advanced Practice Midwife

## 2022-02-12 MED ORDER — MEDROXYPROGESTERONE ACETATE 150 MG/ML IM SUSP
150 MG/ML | Freq: Once | INTRAMUSCULAR | Status: AC
Start: 2022-02-12 — End: 2022-02-12
  Administered 2022-02-12: 15:00:00 150 mg via INTRAMUSCULAR

## 2022-02-12 NOTE — Progress Notes (Signed)
Patient here for post partum visit. Patient is breast feeding. Patient is not vaginally bleeding and has no concerns at this time. Depo given in left deltoid. Patient tolerated well. Discharge instructions given to patient. Advised patient to call office if any concerns. Patient voiced understanding

## 2022-02-12 NOTE — Progress Notes (Signed)
SUBJECTIVE:   22 y.o. [id number] here for post partum exam. Had NSVD of viable female infant on 12/23/2021 with Dr. Cleaster Corin. Pt breastfeeding without difficulty. Denies pain, breasts, belly or bottom. Pt states no longer experiencing VB. She states that she is well bonded with the baby and she is integrating well into the family. 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 She would like to start depo-provera for contraception. States has not had intercourse yet as husband has been traveling for work.    Patient denies any postpartum depression or any baby blues.    Review of Systems:  General ROS: negative  Psychological ROS: negative  ENT ROS: negative  Endocrine ROS: negative  Respiratory ROS: no cough, shortness of breath, or wheezing  Cardiovascular ROS: no chest pain or dyspnea on exertion  Gastrointestinal ROS: no abdominal pain, change in bowel habits, or black or bloody stools  Genito-Urinary ROS: no dysuria, trouble voiding, or hematuria  Musculoskeletal ROS: negative  Neurological ROS: no TIA or stroke symptoms  Dermatological ROS: negative    OBJECTIVE:   BP 116/78 (Position: Sitting)   Pulse 79   Wt 54.9 kg (121 lb 1.6 oz)   LMP  (LMP Unknown)   BMI 22.88 kg/m   No LMP recorded (lmp unknown).     Physical Exam:  GEN: She appears well, afebrile.   HEENT: normal cephalic, atraumatic  CVS: regular rate and rhythm  ABDOMEN: benign, soft, nontender, no masses.  MUSCULOSKELETAL: normal gait, no masses  SKIN: normal texture and tone, no lesions  NEURO: normal tone, no hyperreflexia, 1+DTRs throughout      ASSESSMENT:      Diagnosis Orders   1. Routine postpartum follow-up  medroxyPROGESTERone (DEPO-PROVERA) injection 150 mg          PLAN:   Past medical, social and family history reviewed and updated in pt's chart.   Post partum care reviewed with patient. Pt adjusting well to infant and denies any depressions sx. Risks, benefits and alternative therapies for metrorrhagia  discussed. OCP/FP d/w pt.  Long-acting reversible contraception like IUDs were also discussed with patient.  Patient opted for depo-provera at this time. Risks/benefits/side effects discussed.  Signs & Symptoms of mastitis reviewed; notify if occurs    Orders Placed This Encounter   Medications    medroxyPROGESTERone (DEPO-PROVERA) injection 150 mg       Follow up:  Return in about 3 months (around 05/13/2022), or if symptoms worsen or fail to improve, for Med follow up, depo in 3 mos.         Electronically signed by Peggye Fothergill, APRN - CNM on 02/12/22

## 2022-05-12 ENCOUNTER — Ambulatory Visit: Payer: BLUE CROSS/BLUE SHIELD

## 2022-05-25 ENCOUNTER — Inpatient Hospital Stay
Admit: 2022-05-25 | Discharge: 2022-05-25 | Disposition: A | Discharge status: Home / Self Care | Payer: BLUE CROSS/BLUE SHIELD

## 2022-05-25 DIAGNOSIS — S025XXA Fracture of tooth (traumatic), initial encounter for closed fracture: Secondary | ICD-10-CM

## 2022-05-25 MED ORDER — KETOROLAC TROMETHAMINE 10 MG PO TABS
10 | ORAL_TABLET | Freq: Four times a day (QID) | ORAL | 0 refills | Status: AC | PRN
Start: 2022-05-25 — End: 2022-05-28

## 2022-05-25 MED ORDER — OXYCODONE-ACETAMINOPHEN 5-325 MG PO TABS
5-325 | Freq: Once | ORAL | Status: AC
Start: 2022-05-25 — End: 2022-05-25
  Administered 2022-05-25: 07:00:00 1 via ORAL

## 2022-05-25 MED FILL — OXYCODONE-ACETAMINOPHEN 5-325 MG PO TABS: 5-325 MG | ORAL | Qty: 1

## 2022-05-25 NOTE — Discharge Instructions (Addendum)
Use a beeswax protective layer to prevent cuts

## 2022-05-25 NOTE — ED Provider Notes (Signed)
Independent APP Visit.       [facility] EMERGENCY DEPARTMENT  ED  Encounter Note  Admit Date/RoomTime: 05/25/2022  2:24 AM  ED Room: CHAIR05/C5  NAME: Lynn Frye  DOB: October 04, 2000  MRN: [id number]  PCP: No primary care provider on file.    CHIEF COMPLAINT     Dental Pain (Patient c/o dental pain to front tooth. Patient states she tripped and fell and chipped front tooth. )    HISTORY OF PRESENT ILLNESS        Lynn Frye is a 22 y.o. female who presents to the ED via private vehicle with complaint of dental pain.  States tripped and fell chipping front right incisor.  Painful.  Not bleeding.  No loss of consciousness.  No other injury  REVIEW OF SYSTEMS     Pertinent positives and negatives are stated within HPI, all other systems reviewed and are negative.    Past Medical History:  has a past medical history of Asthma.  Surgical History:  has no past surgical history on file.  Social History:  reports that she has never smoked. She has never been exposed to tobacco smoke. She has never used smokeless tobacco. She reports that she does not drink alcohol and does not use drugs.  Family History: family history includes Heart Attack in her father.   Allergies: Amoxicillin, Cefdinir, and Cefzil [cefprozil]  CURRENT MEDICATIONS       Previous Medications    BUPROPION (WELLBUTRIN XL) 150 MG EXTENDED RELEASE TABLET    Take 1 tablet by mouth every morning    FERROUS SULFATE (IRON 325) 325 (65 FE) MG TABLET    Take 1 tablet by mouth 2 times daily    IBUPROFEN (ADVIL;MOTRIN) 800 MG TABLET    Take 1 tablet by mouth every 8 hours as needed for Pain    PRENATAL VIT-FE FUMARATE-FA (PRENATAL 1+1 PO)    Take by mouth    SERTRALINE (ZOLOFT) 25 MG TABLET    Take 1 tablet by mouth daily       SCREENINGS     Glasgow Coma Scale  Eye Opening: Spontaneous  Best Verbal Response: Oriented  Best Motor Response: Obeys commands  Glasgow Coma Scale Score: 15         CIWA Assessment  BP: 137/82  Pulse:  95       PHYSICAL EXAM   Oxygen Saturation Interpretation: Normal on room air analysis.        ED Triage Vitals   BP Temp Temp Source Pulse Respirations SpO2 Height Weight - Scale   05/25/22 0220 05/25/22 0204 05/25/22 0204 05/25/22 0204 05/25/22 0220 05/25/22 0204 05/25/22 0220 05/25/22 0220   137/82 97.7 F (36.5 C) Oral (!) 110 18 99 % 1.549 m (5\' 1" ) 54.4 kg (120 lb)         Physical Exam  General: Awake alert and oriented.  Well-appearing.  Nontoxic.  HEENT: Normocephalic, atraumatic.  Pupils equal  Right front incisor chipped with no pulp exposed.  No bony tenderness or swelling  Neck: Normal range of motion  Cardiac: Regular rate  Respiratory: Respirations even, unlabored.  No respiratory distress  Abdomen: Nondistended  Musculoskelatal: Moves all extremities x4  Neuro: Nonlateralizing  Skin: Flesh tone, warm, dry  Psych: Normal affect  DIAGNOSTIC RESULTS   (All laboratory and radiology results have been personally reviewed by myself)  Labs:  No results found for this visit on 05/25/22.  Imaging:  All Radiology results interpreted  by Radiologist unless otherwise noted.  No orders to display       ED COURSE   Vitals:    Vitals:    05/25/22 0204 05/25/22 0220 05/25/22 0243   BP:  137/82    Pulse: (!) 110 (!) 108 95   Resp:  18    Temp: 97.7 F (36.5 C)     TempSrc: Oral     SpO2: 99%     Weight:  54.4 kg (120 lb)    Height:  1.549 m (5\' 1" )        Patient was given the following medications:  Medications   oxyCODONE-acetaminophen (PERCOCET) 5-325 MG per tablet 1 tablet (1 tablet Oral Given 05/25/22 0242)          PROCEDURES       REASSESSMENT   05/25/22       Time:   Patient's condition .    CONSULTS:  None  DIFFERENTIAL DX_MDM   MDM:   Social Determinants : None    Records Reviewed : None_ n/a per encounter visit    CC/HPI Summary, DDx, ED Course, and Reassessment: Patient presents with Dental Pain (Patient c/o dental pain to front tooth. Patient states she tripped and fell and chipped front tooth. )  Patient  presenting with chipped front tooth.  Given dental for follow-up.  Medicated with Percocet for pain.  Toradol sent to pharmacy for tomorrow.  She is encouraged to get wax to cover the area to protect from sharp edges    Plan of Care/Counseling:  Bluford Kaufmann Eman, APRN - CNP reviewed today's visit with the patient in addition to providing specific details for the plan of care and counseling regarding the diagnosis and prognosis.  Questions are answered at this time and are agreeable with the plan.    ASSESSMENT     1. Closed fracture of tooth, initial encounter        DISPOSITION   Discharged home.  Patient condition is good    Discharge Instructions:   Patient referred to  [facility]  [address]  [place] [address]  [phone]  Schedule an appointment as soon as possible for a visit   Walk in hours are 7:30-8:00am and 12:30pm- 1:00pm    NEW MEDICATIONS     New Prescriptions    KETOROLAC (TORADOL) 10 MG TABLET    Take 1 tablet by mouth every 6 hours as needed for Pain     Electronically signed by Vanessa Durham, APRN - CNP   DD: 05/25/22  **This report was transcribed using voice recognition software. Every effort was made to ensure accuracy; however, inadvertent computerized transcription errors may be present.  END OF ED PROVIDER NOTE      Vanessa Durham, APRN - CNP  05/25/22 0246
